# Patient Record
Sex: Female | Born: 1995 | Race: White | Hispanic: No | Marital: Married | Smoking: Former smoker
Health system: Southern US, Community
[De-identification: ages and names within clinical notes are randomized; demographics above are authoritative.]

## PROBLEM LIST (undated history)

## (undated) ENCOUNTER — Ambulatory Visit: Payer: Medicaid Other

## (undated) ENCOUNTER — Inpatient Hospital Stay: Payer: Self-pay

## (undated) DIAGNOSIS — O09212 Supervision of pregnancy with history of pre-term labor, second trimester: Secondary | ICD-10-CM

## (undated) DIAGNOSIS — N912 Amenorrhea, unspecified: Secondary | ICD-10-CM

## (undated) DIAGNOSIS — E669 Obesity, unspecified: Secondary | ICD-10-CM

## (undated) DIAGNOSIS — O34219 Maternal care for unspecified type scar from previous cesarean delivery: Secondary | ICD-10-CM

## (undated) DIAGNOSIS — Z8759 Personal history of other complications of pregnancy, childbirth and the puerperium: Secondary | ICD-10-CM

## (undated) HISTORY — DX: Obesity, unspecified: E66.9

## (undated) HISTORY — DX: Supervision of pregnancy with history of pre-term labor, second trimester: O09.212

## (undated) HISTORY — DX: Maternal care for unspecified type scar from previous cesarean delivery: O34.219

## (undated) HISTORY — DX: Amenorrhea, unspecified: N91.2

## (undated) HISTORY — PX: INSERTION OF IMPLANON ROD: OBO 1005

## (undated) HISTORY — PX: REMOVAL OF IMPLANON ROD: OBO 1006

## (undated) HISTORY — PX: LAPAROSCOPIC APPENDECTOMY: SHX408

## (undated) HISTORY — DX: Personal history of other complications of pregnancy, childbirth and the puerperium: Z87.59

## (undated) HISTORY — PX: APPENDECTOMY: SHX54

---
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
## 2014-10-28 LAB — CBC AND DIFFERENTIAL
HCT: 47 % — AB (ref 36–46)
HEMOGLOBIN: 15.8 g/dL (ref 12.0–16.0)
NEUTROS ABS: 6 /uL
PLATELETS: 389 10*3/uL (ref 150–399)
WBC: 9.7 10^3/mL

## 2014-10-28 LAB — HEPATIC FUNCTION PANEL
ALT: 35 U/L (ref 7–35)
AST: 26 U/L (ref 13–35)
Alkaline Phosphatase: 128 U/L — AB (ref 25–125)
Bilirubin, Total: 0.3 mg/dL

## 2014-10-28 LAB — BASIC METABOLIC PANEL
BUN: 8 mg/dL (ref 4–21)
Creatinine: 0.7 mg/dL (ref 0.5–1.1)
GLUCOSE: 81 mg/dL
Potassium: 4.5 mmol/L (ref 3.4–5.3)
Sodium: 139 mmol/L (ref 137–147)

## 2014-10-28 LAB — TSH: TSH: 1.17 u[IU]/mL (ref 0.41–5.90)

## 2015-01-03 ENCOUNTER — Encounter: Payer: Self-pay | Admitting: Family Medicine

## 2015-01-05 ENCOUNTER — Ambulatory Visit: Payer: Self-pay | Admitting: Family Medicine

## 2015-01-16 ENCOUNTER — Ambulatory Visit: Payer: Self-pay | Admitting: Family Medicine

## 2015-02-06 ENCOUNTER — Encounter: Payer: Self-pay | Admitting: Emergency Medicine

## 2015-02-06 ENCOUNTER — Emergency Department
Admission: EM | Admit: 2015-02-06 | Discharge: 2015-02-06 | Disposition: A | Discharge status: Home / Self Care | Payer: Medicaid Other | Attending: Emergency Medicine | Admitting: Emergency Medicine

## 2015-02-06 DIAGNOSIS — L03316 Cellulitis of umbilicus: Secondary | ICD-10-CM

## 2015-02-06 DIAGNOSIS — F17210 Nicotine dependence, cigarettes, uncomplicated: Secondary | ICD-10-CM | POA: Diagnosis not present

## 2015-02-06 DIAGNOSIS — O9989 Other specified diseases and conditions complicating pregnancy, childbirth and the puerperium: Secondary | ICD-10-CM | POA: Diagnosis present

## 2015-02-06 DIAGNOSIS — O99330 Smoking (tobacco) complicating pregnancy, unspecified trimester: Secondary | ICD-10-CM | POA: Diagnosis not present

## 2015-02-06 DIAGNOSIS — O99719 Diseases of the skin and subcutaneous tissue complicating pregnancy, unspecified trimester: Secondary | ICD-10-CM | POA: Diagnosis not present

## 2015-02-06 DIAGNOSIS — Z3490 Encounter for supervision of normal pregnancy, unspecified, unspecified trimester: Secondary | ICD-10-CM

## 2015-02-06 DIAGNOSIS — Z3A00 Weeks of gestation of pregnancy not specified: Secondary | ICD-10-CM | POA: Diagnosis not present

## 2015-02-06 DIAGNOSIS — R1084 Generalized abdominal pain: Secondary | ICD-10-CM | POA: Insufficient documentation

## 2015-02-06 LAB — URINALYSIS COMPLETE WITH MICROSCOPIC (ARMC ONLY)
BILIRUBIN URINE: NEGATIVE
Glucose, UA: NEGATIVE mg/dL
Hgb urine dipstick: NEGATIVE
Ketones, ur: NEGATIVE mg/dL
NITRITE: NEGATIVE
Protein, ur: NEGATIVE mg/dL
SPECIFIC GRAVITY, URINE: 1.03 (ref 1.005–1.030)
pH: 5 (ref 5.0–8.0)

## 2015-02-06 LAB — COMPREHENSIVE METABOLIC PANEL
ALK PHOS: 103 U/L (ref 38–126)
ALT: 25 U/L (ref 14–54)
AST: 23 U/L (ref 15–41)
Albumin: 4.1 g/dL (ref 3.5–5.0)
Anion gap: 6 (ref 5–15)
BILIRUBIN TOTAL: 0.3 mg/dL (ref 0.3–1.2)
BUN: 9 mg/dL (ref 6–20)
CO2: 25 mmol/L (ref 22–32)
Calcium: 8.6 mg/dL — ABNORMAL LOW (ref 8.9–10.3)
Chloride: 108 mmol/L (ref 101–111)
Creatinine, Ser: 0.7 mg/dL (ref 0.44–1.00)
GFR calc Af Amer: >60 mL/min (ref >60)
GFR calc non Af Amer: >60 mL/min (ref >60)
Glucose, Bld: 93 mg/dL (ref 65–99)
POTASSIUM: 3.2 mmol/L — AB (ref 3.5–5.1)
Sodium: 139 mmol/L (ref 135–145)
Total Protein: 7.1 g/dL (ref 6.5–8.1)

## 2015-02-06 LAB — CBC
HEMATOCRIT: 43.2 % (ref 35.0–47.0)
Hemoglobin: 14.8 g/dL (ref 12.0–16.0)
MCH: 31.2 pg (ref 26.0–34.0)
MCHC: 34.2 g/dL (ref 32.0–36.0)
MCV: 91.2 fL (ref 80.0–100.0)
Platelets: 320 10*3/uL (ref 150–440)
RBC: 4.74 MIL/uL (ref 3.80–5.20)
RDW: 12.9 % (ref 11.5–14.5)
WBC: 12.3 10*3/uL — ABNORMAL HIGH (ref 3.6–11.0)

## 2015-02-06 LAB — POCT PREGNANCY, URINE: Preg Test, Ur: POSITIVE — AB

## 2015-02-06 MED ORDER — CEPHALEXIN 500 MG PO CAPS: 500.0000 mg | ORAL_CAPSULE | Freq: Four times a day (QID) | ORAL | Status: AC

## 2015-02-06 MED ORDER — SULFAMETHOXAZOLE-TRIMETHOPRIM 800-160 MG PO TABS: 1.0000 | ORAL_TABLET | Freq: Two times a day (BID) | ORAL | Status: DC

## 2015-02-06 MED ORDER — CEPHALEXIN 500 MG PO CAPS: 500.0000 mg | ORAL_CAPSULE | Freq: Four times a day (QID) | ORAL | Status: DC

## 2015-02-06 MED ORDER — PRENATAL VITAMINS 0.8 MG PO TABS: 1.0000 | ORAL_TABLET | Freq: Every day | ORAL | Status: DC

## 2015-02-06 MED ORDER — BACITRACIN ZINC 500 UNIT/GM EX OINT: TOPICAL_OINTMENT | CUTANEOUS | Status: DC

## 2015-02-06 MED ORDER — CEPHALEXIN 500 MG PO CAPS
500.0000 mg | ORAL_CAPSULE | Freq: Once | ORAL | Status: AC
  Administered 2015-02-06: 500 mg via ORAL
  Filled 2015-02-06: qty 1

## 2015-02-06 MED ORDER — SULFAMETHOXAZOLE-TRIMETHOPRIM 800-160 MG PO TABS: 1.0000 | ORAL_TABLET | Freq: Once | ORAL | Status: DC

## 2015-02-06 NOTE — Discharge Instructions (Signed)

## 2015-02-06 NOTE — ED Provider Notes (Addendum)
[facility] Emergency Department Provider Note  ____________________________________________  Time seen: Approximately 845 PM  I have reviewed the triage vital signs and the nursing notes.   HISTORY  Chief Complaint Abdominal Pain    HPI Colleen Nicholson is a 19 y.o. female with a history of amenorrhea who presents today with discharge from belly button and pain around his belly button over the past week. Denies any fever. Denies any nausea or vomiting. Denies any vaginal discharge. Denies any dysuria.   Past Medical History  Diagnosis Date  . Amenorrhea   . Obesity     Patient Active Problem List   Diagnosis Date Noted  . Irregular periods/menstrual cycles 01/03/2015  . History of chlamydia infection 01/03/2015  . Family planning 01/03/2015  . Obesity (BMI 30-39.9) 01/03/2015    Past Surgical History  Procedure Laterality Date  . No past surgeries      No current outpatient prescriptions on file.  Allergies Review of patient's allergies indicates no known allergies.  No family history on file.  Social History History  Substance Use Topics  . Smoking status: Current Every Day Smoker -- 0.50 packs/day  . Smokeless tobacco: Not on file  . Alcohol Use: No    Review of Systems Constitutional: No fever/chills Eyes: No visual changes. ENT: No sore throat. Cardiovascular: Denies chest pain. Respiratory: Denies shortness of breath. Gastrointestinal: No abdominal pain.  No nausea, no vomiting.  No diarrhea.  No constipation. Genitourinary: Negative for dysuria. Musculoskeletal: Negative for back pain. Skin: Negative for rash. Neurological: Negative for headaches, focal weakness or numbness.  10-point ROS otherwise negative.  ____________________________________________   PHYSICAL EXAM:  VITAL SIGNS: ED Triage Vitals  Enc Vitals Group     BP 02/06/15 1755 134/69 mmHg     Pulse Rate 02/06/15 1755 107     Resp 02/06/15 1755 16     Temp 02/06/15 1755 98.5 F (36.9 C)     Temp Source 02/06/15 1755 Oral     SpO2 02/06/15 1755 97 %     Weight 02/06/15 1755 175 lb (79.379 kg)     Height 02/06/15 1755  (1.651 m)     Head Cir --      Peak Flow --      Pain Score 02/06/15 1807 5     Pain Loc --      Pain Edu? --      Excl. in GC? --     Constitutional: Alert and oriented. Well appearing and in no acute distress. Eyes: Conjunctivae are normal. PERRL. EOMI. Head: Atraumatic. Nose: No congestion/rhinnorhea. Mouth/Throat: Mucous membranes are moist.  Oropharynx non-erythematous. Neck: No stridor.   Cardiovascular: Normal rate, regular rhythm. Grossly normal heart sounds.  Good peripheral circulation. Respiratory: Normal respiratory effort.  No retractions. Lungs CTAB. Gastrointestinal: Soft and nontender. No distention. No abdominal bruits. No CVA tenderness. Musculoskeletal: No lower extremity tenderness nor edema.  No joint effusions. Neurologic:  Normal speech and language. No gross focal neurologic deficits are appreciated. No gait instability. Skin:  Skin is warm, dry and intact. Small amount of crusted blood within around the umbilicus. No visible pus or laceration or abrasion. Umbilicus is examined to the base without any visible break in the skin. No surrounding induration. No tenderness palpation. No fluctuance.  Psychiatric: Mood and affect are normal. Speech and behavior are normal.  ____________________________________________   LABS (all labs ordered are listed, but only abnormal results are displayed)  Labs Reviewed  COMPREHENSIVE METABOLIC PANEL -  Abnormal; Notable for the following:    Potassium 3.2 (*)    Calcium 8.6 (*)    All other components within normal limits  CBC - Abnormal; Notable for the following:    WBC 12.3 (*)    All other components within normal limits  URINALYSIS COMPLETEWITH MICROSCOPIC ([facility] ONLY) - Abnormal; Notable for the following:    Color, Urine YELLOW (*)     APPearance HAZY (*)    Leukocytes, UA 3+ (*)    Bacteria, UA RARE (*)    Squamous Epithelial / LPF 6-30 (*)    All other components within normal limits  POC URINE PREG, ED   ____________________________________________  EKG   ____________________________________________  RADIOLOGY   ____________________________________________   PROCEDURES    ____________________________________________   INITIAL IMPRESSION / ASSESSMENT AND PLAN / ED COURSE  Pertinent labs & imaging results that were available during my care of the patient were reviewed by me and considered in my medical decision making (see chart for details).  ----------------------------------------- 9:12 PM on 02/06/2015 -----------------------------------------  Patient resting comfortable at this time. We'll treat for cellulitis at the umbilicus. We'll also give antibacterial ointment prescription. Counseled patient to keep the area dry. Elevated white count likely related to cellulitis. 3+ leukocytes in the urine and a contaminated sample. No dysuria. ____________________________________________   FINAL CLINICAL IMPRESSION(S) / ED DIAGNOSES  Acute cellulitis. Initial visit.    Myrna Blazer, MD 02/06/15 2112 heart rate in the room at this time is 66 bpm.  Myrna Blazer, MD 02/06/15 2113  Patient found to have a positive pregnancy test. I will not worker for pregnancy with abdominal pain at this time because it is clear the abdominal pain is coming from the umbilical issue. I counseled the patient as well as her husband about this nail will follow-up at [facility] side OB/GYN with a been seen previously. The patient is unsure how far along she may be because she does not have a period regularly. She says she was on Depo-Provera for years and has not menstruated since being off of Depo-Provera.  Myrna Blazer, MD 02/06/15 2122

## 2015-02-06 NOTE — ED Notes (Signed)
Pt reports 5 days of periumbilical pain with yellow drainage and stomach cramps.

## 2015-02-06 NOTE — ED Notes (Signed)
Patient presents to the ED with pain around her umbilical area, patient reports blood and yellow drainage from umbilical area.  Patient also reports occasional abdominal cramps x 1 week.  Denies vomiting and diarrhea.

## 2015-03-03 ENCOUNTER — Emergency Department
Admission: EM | Admit: 2015-03-03 | Discharge: 2015-03-03 | Disposition: A | Discharge status: Home / Self Care | Payer: Medicaid Other | Attending: Emergency Medicine | Admitting: Emergency Medicine

## 2015-03-03 ENCOUNTER — Emergency Department: Payer: Medicaid Other

## 2015-03-03 DIAGNOSIS — F17210 Nicotine dependence, cigarettes, uncomplicated: Secondary | ICD-10-CM | POA: Insufficient documentation

## 2015-03-03 DIAGNOSIS — O99331 Smoking (tobacco) complicating pregnancy, first trimester: Secondary | ICD-10-CM | POA: Diagnosis not present

## 2015-03-03 DIAGNOSIS — O200 Threatened abortion: Secondary | ICD-10-CM | POA: Diagnosis not present

## 2015-03-03 DIAGNOSIS — O209 Hemorrhage in early pregnancy, unspecified: Secondary | ICD-10-CM | POA: Diagnosis present

## 2015-03-03 DIAGNOSIS — Z3A01 Less than 8 weeks gestation of pregnancy: Secondary | ICD-10-CM | POA: Insufficient documentation

## 2015-03-03 DIAGNOSIS — Z79899 Other long term (current) drug therapy: Secondary | ICD-10-CM | POA: Diagnosis not present

## 2015-03-03 LAB — BASIC METABOLIC PANEL
Anion gap: 7 (ref 5–15)
BUN: 5 mg/dL — ABNORMAL LOW (ref 6–20)
CALCIUM: 9.5 mg/dL (ref 8.9–10.3)
CO2: 26 mmol/L (ref 22–32)
CREATININE: 0.59 mg/dL (ref 0.44–1.00)
Chloride: 102 mmol/L (ref 101–111)
GFR calc non Af Amer: >60 mL/min (ref >60)
Glucose, Bld: 68 mg/dL (ref 65–99)
Potassium: 4.2 mmol/L (ref 3.5–5.1)
SODIUM: 135 mmol/L (ref 135–145)

## 2015-03-03 LAB — CBC
HCT: 44 % (ref 35.0–47.0)
HEMOGLOBIN: 14.4 g/dL (ref 12.0–16.0)
MCH: 29.9 pg (ref 26.0–34.0)
MCHC: 32.7 g/dL (ref 32.0–36.0)
MCV: 91.5 fL (ref 80.0–100.0)
Platelets: 416 10*3/uL (ref 150–440)
RBC: 4.81 MIL/uL (ref 3.80–5.20)
RDW: 12.8 % (ref 11.5–14.5)
WBC: 9.7 10*3/uL (ref 3.6–11.0)

## 2015-03-03 LAB — HCG, QUANTITATIVE, PREGNANCY: HCG, BETA CHAIN, QUANT, S: 103863 m[IU]/mL — AB (ref <5)

## 2015-03-03 NOTE — Discharge Instructions (Signed)
Please follow-up with your OB/GYN physician in 48 hours for recheck of her beta hCG level. Today's pregnancy hormone level is 103,000. Return to the emergency department for any abdominal pain, increased bleeding, or any other symptom personally concerning to yourself   Threatened Miscarriage A threatened miscarriage is when you have vaginal bleeding during your first 20 weeks of pregnancy but the pregnancy has not ended. Your doctor will do tests to make sure you are still pregnant. The cause of the bleeding may not be known. This condition does not mean your pregnancy will end. It does increase the risk of it ending (complete miscarriage). HOME CARE   Make sure you keep all your doctor visits for prenatal care.  Get plenty of rest.  Do not have sex or use tampons if you have vaginal bleeding.  Do not douche.  Do not smoke or use drugs.  Do not drink alcohol.  Avoid caffeine. GET HELP IF:  You have light bleeding from your vagina.  You have belly pain or cramping.  You have a fever. GET HELP RIGHT AWAY IF:   You have heavy bleeding from your vagina.  You have clots of blood coming from your vagina.  You have bad pain or cramps in your low back or belly.  You have fever, chills, and bad belly pain. MAKE SURE YOU:   Understand these instructions.  Will watch your condition.  Will get help right away if you are not doing well or get worse. Document Released: 06/16/2008 Document Revised: 07/09/2013 Document Reviewed: 04/30/2013 [facility] Patient Information 2015 [place], [place]. This information is not intended to replace advice given to you by your health care provider. Make sure you discuss any questions you have with your health care provider.

## 2015-03-03 NOTE — ED Provider Notes (Signed)
[facility] Emergency Department Provider Note  Time seen: 3:47 PM  I have reviewed the triage vital signs and the nursing notes.   HISTORY  Chief Complaint Vaginal Bleeding    HPI Colleen Nicholson is a 19 y.o. female who is approximately [redacted] weeks pregnant who presents the emergency department with vaginal bleeding. According to the patient she is [redacted] weeks pregnant by her last period. She has not had an ultrasound yet. She awoke this morning with vaginal bleeding when she wiped. Had one more episode later this morning. It has since resolved per patient. Denies any abdominal pain or cramping at any time. Denies nausea or vomiting. Denies diarrhea or dysuria.     Past Medical History  Diagnosis Date  . Amenorrhea   . Obesity     Patient Active Problem List   Diagnosis Date Noted  . Irregular periods/menstrual cycles 01/03/2015  . History of chlamydia infection 01/03/2015  . Family planning 01/03/2015  . Obesity (BMI 30-39.9) 01/03/2015    Past Surgical History  Procedure Laterality Date  . No past surgeries      Current Outpatient Rx  Name  Route  Sig  Dispense  Refill  . Prenatal Multivit-Min-Fe-FA (PRENATAL VITAMINS) 0.8 MG tablet   Oral   Take 1 tablet by mouth daily.   30 tablet   0     Allergies Review of patient's allergies indicates no known allergies.  No family history on file.  Social History Social History  Substance Use Topics  . Smoking status: Current Every Day Smoker -- 0.50 packs/day    Types: Cigarettes  . Smokeless tobacco: None  . Alcohol Use: No    Review of Systems Constitutional: Negative for fever. Cardiovascular: Negative for chest pain. Respiratory: Negative for shortness of breath. Gastrointestinal: Negative for abdominal pain, vomiting and diarrhea. Genitourinary: Negative for dysuria. Positive for vaginal bleeding, which is now resolved. Musculoskeletal: Negative for back pain 10-point ROS otherwise  negative.  ____________________________________________   PHYSICAL EXAM:  VITAL SIGNS: ED Triage Vitals  Enc Vitals Group     BP 03/03/15 1320 128/69 mmHg     Pulse Rate 03/03/15 1320 102     Resp 03/03/15 1320 16     Temp 03/03/15 1320 97.8 F (36.6 C)     Temp Source 03/03/15 1320 Oral     SpO2 03/03/15 1320 97 %     Weight 03/03/15 1320 200 lb (90.719 kg)     Height 03/03/15 1320  (1.626 m)     Head Cir --      Peak Flow --      Pain Score --      Pain Loc --      Pain Edu? --      Excl. in GC? --     Constitutional: Alert and oriented. Well appearing and in no distress. Eyes: Normal exam ENT   Mouth/Throat: Mucous membranes are moist. Cardiovascular: Normal rate, regular rhythm. No murmurs, rubs, or gallops. Respiratory: Normal respiratory effort without tachypnea nor retractions. Breath sounds are clear and equal bilaterally. No wheezes/rales/rhonchi. Gastrointestinal: Soft and nontender. No distention.  Musculoskeletal: Nontender with normal range of motion in all extremities. Neurologic:  Normal speech and language. No gross focal neurologic deficits Skin:  Skin is warm, dry and intact.  Psychiatric: Mood and affect are normal. Speech and behavior are normal.  ____________________________________________      RADIOLOGY  Ultrasound consistent with 7 week 4 day fetus.  ____________________________________________  INITIAL IMPRESSION / ASSESSMENT AND PLAN / ED COURSE  Pertinent labs & imaging results that were available during my care of the patient were reviewed by me and considered in my medical decision making (see chart for details).  Patient approximately [redacted] weeks pregnant with vaginal bleeding today which is now resolved. We will check labs, and an ultrasound to help further evaluate. Patient is agreeable to plan. No pain currently or at any time per patient.  Ultrasound shows no acute abnormalities. Small subchorionic hemorrhages present.  Discussed results with the patient, and the need to follow up with OB/GYN in 48 hours for recheck of her beta hCG. The patient is agreeable.  ____________________________________________   FINAL CLINICAL IMPRESSION(S) / ED DIAGNOSES  Threatened miscarriage   Minna Antis, MD 03/03/15 [phone]

## 2015-03-03 NOTE — ED Notes (Signed)
Pt c/o vaginal bleeding when she wipes that started this morning..states she is about [redacted] weeks pregnant.Marland Kitchendenies any cramping or pain at present.

## 2015-03-03 NOTE — ED Notes (Signed)
Pt reports vaginal bleeding that began this AM, pt states no pain

## 2015-03-04 LAB — ABO/RH: ABO/RH(D): O POS

## 2015-03-16 ENCOUNTER — Ambulatory Visit
Admission: RE | Admit: 2015-03-16 | Discharge: 2015-03-16 | Disposition: A | Discharge status: Home / Self Care | Payer: Medicaid Other | Source: Ambulatory Visit | Attending: Maternal & Fetal Medicine | Admitting: Maternal & Fetal Medicine

## 2015-03-16 VITALS — BP 127/76 | HR 95 | Temp 98.8°F | Ht 65.5 in | Wt 223.0 lb

## 2015-03-16 DIAGNOSIS — Z820 Family history of epilepsy and other diseases of the nervous system: Secondary | ICD-10-CM

## 2015-03-16 DIAGNOSIS — Z8279 Family history of other congenital malformations, deformations and chromosomal abnormalities: Secondary | ICD-10-CM

## 2015-03-16 NOTE — Progress Notes (Signed)
[name] presented to the [facility] in [place] for genetic counseling to discuss her partner's personal history of Neurofibromatosis.  Because she was unaware of the reason for referral and because her husband was not present, the couple elected to reschedule their appointment to 03/18/15 @ 2pm.  I had the opportunity to speak with Mr. Leichter by phone who provided me with verbal consent to review his medical records through [facility].  His name and date of birth are Colleen Nicholson, dob: [birth year].  I was immediately available and supervising. Argentina Ponder, MD [facility]

## 2015-03-19 ENCOUNTER — Ambulatory Visit
Admission: RE | Admit: 2015-03-19 | Discharge: 2015-03-19 | Disposition: A | Discharge status: Home / Self Care | Payer: Medicaid Other | Source: Ambulatory Visit | Attending: Obstetrics & Gynecology | Admitting: Obstetrics & Gynecology

## 2015-03-19 VITALS — BP 137/75 | HR 101 | Temp 98.1°F | Ht 64.8 in | Wt 222.4 lb

## 2015-03-19 DIAGNOSIS — Z820 Family history of epilepsy and other diseases of the nervous system: Secondary | ICD-10-CM

## 2015-03-19 DIAGNOSIS — Z8279 Family history of other congenital malformations, deformations and chromosomal abnormalities: Secondary | ICD-10-CM

## 2015-03-19 NOTE — Progress Notes (Addendum)
Referring Provider:  [facility] 40 minute consultation  Colleen Nicholson was referred to [facility] [place] for genetic counseling due to a family history of Neurofibromatosis and to review routine prenatal screening and testing options.  This note summarizes the information we discussed.   We first obtained a detailed family history.  Ms. Kvamme reported that her partner, Colleen Nicholson (dob 07/03/1991) has been diagnosed with von Recklinghausen Neurofibromatosis (NF1).  He said he has had multiple caf-au-lait spots, freckling and a few neurofibromas during his lifetime.  He was diagnosed as a child and is followed at [facility].  After obtaining permission, a review of his records showed documentation that he is seen there with a diagnosis of NF1 but no genetic testing was found.  There are no other persons in the family that have been diagnosed with NF or who have caf au lait spots, tumors or health concerns related to NF.  The remainder of the family history was remarkable for Colleen Nicholson having a paternal first cousin who passed away in early childhood with some type of "syndrome", but he was not sure what type.  Colleen Nicholson also reported a maternal first cousin with developmental differences and hearing impairment.  We talked about the fact that there may be many different syndromes and many causes for developmental differences.  Without additional medical information on the cause for the conditions in these relatives, an accurate recurrence risk assessment cannot be provided.  If more is learned, we are happy to discuss this further. There were no other persons with birth defects, developmental disabilities, recurrent miscarriages or known genetic conditions in the family.  Colleen Nicholson has had no complications or exposures in the pregnancy which are expected to increase the risk for birth defects.    To review, NF is a condition characterized by the presence of multiple caf-au-lait  spots, axillary and inguinal freckling, multiple cutaneous neurofibromas, and iris Lisch nodules.  Many patients will also have plexiform neurofibromas, optic nerve and other central nervous system gliomas, malignant peripheral nerve sheath tumors or other health concerns.  Up to 50% of persons with NF will have some learning difficulties.  The degree of severity of NF can vary a great deal from one person to another, even within the same family.  NF is inherited at a dominant genetic condition.  To review, genes are the instructions that direct the growth and development of our bodies.  We all have two copies of each of our genes, one from our mother and one from our father.  Genes are packaged into chromosomes.  There are 46 chromosomes in each cell, matched up into 23 pairs.  The gene that causes NF, called NF1, is located on chromosome number 37.  When there is a change one of the copies of this gene, then a person will develop the symptoms of NF. This is called a dominant trait.  If a parent has NF, there is a 50% chance they will pass on the normal copy of the NF1 gene to each of their children and a 50% chance they would pass on the copy with the change that causes the condition.  It is not unusual for a family member to have NF with no other persons in the family having the condition.  We know that about 50% of cases of NF are due to a new change in the gene (mutation).  This change occurs for the first time in that one individual and is not present  in the parents or siblings of the affected person.  A careful examination is required to determine if any other family members have subtle findings of NF.  It can be difficult to see all the symptoms of NF in babies and young children.  We expect >97% of persons with the gene change for NF to have caf-au-lait spots by age 13 years and >93% of persons with the gene to have neurofibromas by age 43 years.  Once a person is diagnosed with NF, there is a 50% chance  for each of their children to inherit the changed gene and thus develop the condition.   To be diagnosed with NF1, a person must meet certain diagnostic criteria. For a child whose parent has NF, it is only necessary to have one clinical feature of NF to be diagnosed with the condition (such as caf-au-lait spots).  Most individuals with NF are diagnosed only by physical examination, not by genetic testing. We have not found medical records to indicate that Colleen Nicholson had any genetic testing in the past.  If Colleen Nicholson were interested in prenatal testing for this pregnancy, then it would be necessary to have genetic test results first on her partner.  If a mutation were identified, then testing on the pregnancy would be available through amniocentesis.  Amniocentesis is an invasive diagnostic test that uses a needle to remove a sample of amniotic fluid.  There are fetal cells in the amniotic fluid which could be tested for the genetic change.  The risk of complications including miscarriage related to the amniocentesis procedure is estimated to be 1 in 200. While prenatal testing could be used to determine if a baby had the gene change to cause NF, it is not able to predict the severity of the condition.    The baby can also be assessed after delivery for signs of NF.  After delivery, we encourage Colleen Nicholson to make her pediatrician aware of this history and to consider a consultation with Medical Genetics to have the baby examined for features of NF.  [facility] can be reached by calling [phone].    In addition to discussing the family history, the following routine screening test options were discussed:   First trimester screening, which includes nuchal translucency ultrasound screen and first trimester maternal serum marker screening.  The nuchal translucency has approximately an 80% detection rate for Down syndrome and can be positive for other chromosome abnormalities as well  as heart defects.  When combined with a maternal serum marker screening, the detection rate is up to 90% for Down syndrome and up to 97% for trisomy 18.     Maternal serum marker screening, a blood test that measures pregnancy proteins, can provide risk assessments for Down syndrome, trisomy 18, and open neural tube defects (spina bifida, anencephaly). Because it does not directly examine the fetus, it cannot positively diagnose or rule out these problems.  Targeted ultrasound uses high frequency sound waves to create an image of the developing fetus.  An ultrasound is often recommended as a routine means of evaluating the pregnancy.  It is also used to screen for fetal anatomy problems (for example, a heart defect) that might be suggestive of a chromosomal or other abnormality.   Should these screening tests indicate an increased concern, then the following diagnostic options would be offered:  The chorionic villus sampling procedure is available for first trimester chromosome analysis.  This involves the withdrawal of a small amount of  chorionic villi (tissue from the developing placenta).  Risk of pregnancy loss is estimated to be approximately 1 in 200 to 1 in 100 (0.5 to 1%).  There is approximately a 1% (1 in 100) chance that the CVS chromosome results will be unclear.  Chorionic villi cannot be tested for neural tube defects.     Amniocentesis involves the removal of a small amount of amniotic fluid from the sac surrounding the fetus with the use of a thin needle inserted through the maternal abdomen and uterus.  Ultrasound guidance is used throughout the procedure.  Fetal cells from amniotic fluid are directly evaluated and > 99.5% of chromosome problems and > 98% of open neural tube defects can be detected. This procedure is generally performed after the 15th week of pregnancy.  The main risks to this procedure include complications leading to miscarriage in less than 1 in 200 cases  (0.5%).  Circulating cell free fetal DNA testing may be used to determine whether or not the baby may have either Down syndrome, trisomy 13, or trisomy 72. This test utilizes a maternal blood sample and DNA sequencing technology to isolate circulating cell free fetal DNA from maternal plasma. The fetal DNA can then be analyzed for DNA sequences that are derived from the three most common chromosomes involved in aneuploidy, chromosomes 13, 18, and 21. If the overall amount of DNA is greater than the expected level for any of these chromosomes, aneuploidy is suspected. While we do not consider it a replacement for invasive testing and karyotype analysis, a negative result from this testing would be reassuring, though not a guarantee of a normal chromosome complement for the baby. An abnormal result is certainly suggestive of an abnormal chromosome complement, though we would still recommend CVS or amniocentesis to confirm any findings from this testing.   Cystic Fibrosis screening was also discussed with the patient. Cystic fibrosis (CF) is one of the most common genetic conditions in persons of Caucasian ancestry.  This condition occurs in approximately 1 in 2,500 Caucasian persons and results in thickened secretions in the lungs, digestive, and reproductive systems.  For a baby to be at risk for having CF, both of the parents must be carriers for this condition.  Approximately 1 in 25 Caucasian persons is a carrier for CF.  Current carrier testing looks for the most common mutations in the gene for CF and can detect approximately 90% of carriers in the Caucasian population.  This means that the carrier screening can greatly reduce, but cannot eliminate, the chance for an individual to have a child with CF.  If an individual is found to be a carrier for CF, then carrier testing would be available for the partner.  CF has also been added to the newborn screening panel in [place], so all newborn babies will be tested at  the time of delivery.  After consideration of the options, the patient elected to proceed with first trimester screening as previously scheduled at her OB office.  She declined CF carrier testing and does not desire prenatal diagnosis for NF in this pregnancy due to the risks.  The patient was encouraged to call with questions or concerns.  We can be contacted at [phone].  Cherly Anderson, MS, CGC  I was immediately available. Savas Elvin, [place] A, MD

## 2015-07-21 ENCOUNTER — Encounter
Admission: EM | Disposition: A | Discharge status: Other Acute Inpatient Hospital | Payer: Self-pay | Source: Home / Self Care | Attending: Obstetrics and Gynecology

## 2015-07-21 ENCOUNTER — Encounter: Payer: Self-pay | Admitting: *Deleted

## 2015-07-21 ENCOUNTER — Inpatient Hospital Stay
Admission: EM | Admit: 2015-07-21 | Discharge: 2015-07-21 | DRG: 778 | Payer: Medicaid Other | Attending: Obstetrics and Gynecology | Admitting: Obstetrics and Gynecology

## 2015-07-21 DIAGNOSIS — O99332 Smoking (tobacco) complicating pregnancy, second trimester: Secondary | ICD-10-CM | POA: Diagnosis present

## 2015-07-21 DIAGNOSIS — Z820 Family history of epilepsy and other diseases of the nervous system: Secondary | ICD-10-CM | POA: Diagnosis not present

## 2015-07-21 DIAGNOSIS — O328XX0 Maternal care for other malpresentation of fetus, not applicable or unspecified: Secondary | ICD-10-CM | POA: Diagnosis present

## 2015-07-21 DIAGNOSIS — E669 Obesity, unspecified: Secondary | ICD-10-CM

## 2015-07-21 DIAGNOSIS — Z8279 Family history of other congenital malformations, deformations and chromosomal abnormalities: Secondary | ICD-10-CM

## 2015-07-21 DIAGNOSIS — Z3A27 27 weeks gestation of pregnancy: Secondary | ICD-10-CM

## 2015-07-21 DIAGNOSIS — Z6838 Body mass index (BMI) 38.0-38.9, adult: Secondary | ICD-10-CM

## 2015-07-21 DIAGNOSIS — Z79899 Other long term (current) drug therapy: Secondary | ICD-10-CM | POA: Diagnosis not present

## 2015-07-21 DIAGNOSIS — O99212 Obesity complicating pregnancy, second trimester: Secondary | ICD-10-CM | POA: Diagnosis present

## 2015-07-21 DIAGNOSIS — F17210 Nicotine dependence, cigarettes, uncomplicated: Secondary | ICD-10-CM | POA: Diagnosis present

## 2015-07-21 DIAGNOSIS — Z720 Tobacco use: Secondary | ICD-10-CM | POA: Diagnosis present

## 2015-07-21 DIAGNOSIS — O321XX0 Maternal care for breech presentation, not applicable or unspecified: Secondary | ICD-10-CM | POA: Diagnosis present

## 2015-07-21 DIAGNOSIS — O0992 Supervision of high risk pregnancy, unspecified, second trimester: Secondary | ICD-10-CM

## 2015-07-21 DIAGNOSIS — F17200 Nicotine dependence, unspecified, uncomplicated: Secondary | ICD-10-CM | POA: Diagnosis present

## 2015-07-21 LAB — URINE DRUG SCREEN, QUALITATIVE (ARMC ONLY)
AMPHETAMINES, UR SCREEN: NOT DETECTED
Barbiturates, Ur Screen: NOT DETECTED
Benzodiazepine, Ur Scrn: NOT DETECTED
Cannabinoid 50 Ng, Ur ~~LOC~~: NOT DETECTED
Cocaine Metabolite,Ur ~~LOC~~: NOT DETECTED
MDMA (ECSTASY) UR SCREEN: NOT DETECTED
METHADONE SCREEN, URINE: NOT DETECTED
OPIATE, UR SCREEN: NOT DETECTED
Phencyclidine (PCP) Ur S: NOT DETECTED
Tricyclic, Ur Screen: NOT DETECTED

## 2015-07-21 LAB — URINALYSIS COMPLETE WITH MICROSCOPIC (ARMC ONLY)
BILIRUBIN URINE: NEGATIVE
Bacteria, UA: NONE SEEN
Glucose, UA: NEGATIVE mg/dL
HGB URINE DIPSTICK: NEGATIVE
Ketones, ur: NEGATIVE mg/dL
Nitrite: NEGATIVE
Protein, ur: 30 mg/dL — AB
Specific Gravity, Urine: 1.02 (ref 1.005–1.030)
pH: 7 (ref 5.0–8.0)

## 2015-07-21 LAB — CBC
HEMATOCRIT: 38.9 % (ref 35.0–47.0)
Hemoglobin: 13.2 g/dL (ref 12.0–16.0)
MCH: 30.5 pg (ref 26.0–34.0)
MCHC: 34 g/dL (ref 32.0–36.0)
MCV: 89.7 fL (ref 80.0–100.0)
Platelets: 289 10*3/uL (ref 150–440)
RBC: 4.33 MIL/uL (ref 3.80–5.20)
RDW: 14.3 % (ref 11.5–14.5)
WBC: 16.7 10*3/uL — AB (ref 3.6–11.0)

## 2015-07-21 LAB — TYPE AND SCREEN
ABO/RH(D): O POS
ANTIBODY SCREEN: NEGATIVE

## 2015-07-21 LAB — WET PREP, GENITAL
CLUE CELLS WET PREP: NONE SEEN
Sperm: NONE SEEN
Trich, Wet Prep: NONE SEEN
Yeast Wet Prep HPF POC: NONE SEEN

## 2015-07-21 LAB — RAPID HIV SCREEN (HIV 1/2 AB+AG)
HIV 1/2 ANTIBODIES: NONREACTIVE
HIV-1 P24 Antigen - HIV24: NONREACTIVE

## 2015-07-21 SURGERY — Surgical Case
Anesthesia: Choice

## 2015-07-21 MED ORDER — OXYTOCIN 10 UNIT/ML IJ SOLN: 10.0000 [IU] | Freq: Once | INTRAMUSCULAR | Status: DC

## 2015-07-21 MED ORDER — LACTATED RINGERS IV SOLN
INTRAVENOUS | Status: DC
  Administered 2015-07-21: 16:00:00 via INTRAVENOUS

## 2015-07-21 MED ORDER — LACTATED RINGERS IV SOLN: 500.0000 mL | INTRAVENOUS | Status: DC | PRN

## 2015-07-21 MED ORDER — MAGNESIUM SULFATE BOLUS VIA INFUSION
4.0000 g | Freq: Once | INTRAVENOUS | Status: DC
  Filled 2015-07-21: qty 500

## 2015-07-21 MED ORDER — OXYTOCIN 40 UNITS IN LACTATED RINGERS INFUSION - SIMPLE MED: 2.5000 [IU]/h | INTRAVENOUS | Status: DC

## 2015-07-21 MED ORDER — MAGNESIUM SULFATE 4 GM/100ML IV SOLN
INTRAVENOUS | Status: AC
  Administered 2015-07-21: 4 g
  Filled 2015-07-21: qty 100

## 2015-07-21 MED ORDER — CITRIC ACID-SODIUM CITRATE 334-500 MG/5ML PO SOLN
ORAL | Status: AC
  Administered 2015-07-21: 30 mL via ORAL
  Filled 2015-07-21: qty 15

## 2015-07-21 MED ORDER — SODIUM CHLORIDE 0.9 % IV SOLN
INTRAVENOUS | Status: AC
  Administered 2015-07-21: 2 g via INTRAVENOUS
  Filled 2015-07-21: qty 2000

## 2015-07-21 MED ORDER — OXYTOCIN BOLUS FROM INFUSION: 500.0000 mL | INTRAVENOUS | Status: DC

## 2015-07-21 MED ORDER — SODIUM CHLORIDE 0.9 % IV SOLN
2.0000 g | Freq: Once | INTRAVENOUS | Status: AC
  Administered 2015-07-21: 2 g via INTRAVENOUS

## 2015-07-21 MED ORDER — ONDANSETRON HCL 4 MG/2ML IJ SOLN: 4.0000 mg | Freq: Four times a day (QID) | INTRAMUSCULAR | Status: DC | PRN

## 2015-07-21 MED ORDER — LIDOCAINE HCL (PF) 1 % IJ SOLN: 30.0000 mL | INTRAMUSCULAR | Status: DC | PRN

## 2015-07-21 MED ORDER — MAGNESIUM SULFATE 50 % IJ SOLN
2.0000 g/h | INTRAVENOUS | Status: DC
  Administered 2015-07-21: 2 g/h via INTRAVENOUS

## 2015-07-21 MED ORDER — CITRIC ACID-SODIUM CITRATE 334-500 MG/5ML PO SOLN
30.0000 mL | ORAL | Status: DC | PRN
  Administered 2015-07-21: 30 mL via ORAL

## 2015-07-21 MED ORDER — BETAMETHASONE SOD PHOS & ACET 6 (3-3) MG/ML IJ SUSP
12.0000 mg | Freq: Every day | INTRAMUSCULAR | Status: DC
Start: 2015-07-21 — End: 2015-07-22
  Administered 2015-07-21: 12 mg via INTRAMUSCULAR

## 2015-07-21 MED ORDER — BETAMETHASONE SOD PHOS & ACET 6 (3-3) MG/ML IJ SUSP
INTRAMUSCULAR | Status: AC
  Administered 2015-07-21: 12 mg via INTRAMUSCULAR
  Filled 2015-07-21: qty 1

## 2015-07-21 MED ORDER — MAGNESIUM SULFATE 50 % IJ SOLN
INTRAVENOUS | Status: AC
  Administered 2015-07-21: 2 g/h via INTRAVENOUS
  Filled 2015-07-21: qty 80

## 2015-07-21 NOTE — Progress Notes (Signed)
L&D Note    Subjective:  Unchanged.  Still occasional contraction. No trouble breathing, no mental status changes.  Objective:   Filed Vitals:   07/21/15 2045 07/21/15 2050 07/21/15 2055 07/21/15 2113  BP:  143/92  134/73  Pulse:  108  104  Temp:    99.1 F (37.3 C)  TempSrc:      Resp:    20  SpO2: 99% 98% 98% 98%    Current Vital Signs 24h Vital Sign Ranges  T 99.1 F (37.3 C) Temp  Avg: 98.9 F (37.2 C)  Min: 98.6 F (37 C)  Max: 99.1 F (37.3 C)  BP 134/73 mmHg BP  Min: 134/73  Max: 143/92  HR (!) 104 Pulse  Avg: 104.3  Min: 101  Max: 108  RR 20 Resp  Avg: 19  Min: 18  Max: 20  SaO2 98 %   SpO2  Avg: 98.2 %  Min: 98 %  Max: 99 %      Gen: NAD SVE: 4-5/80/-1 (no change)  Medications SCHEDULED MEDICATIONS  . betamethasone acetate-betamethasone sodium phosphate  12 mg Intramuscular Daily  . magnesium  4 g Intravenous Once  . oxytocin  10 Units Intramuscular Once    MEDICATION INFUSIONS  . lactated ringers 125 mL/hr at 07/21/15 1615  . magnesium sulfate 2 g/hr (07/21/15 1635)  . oxytocin 40 units in LR 1000 mL    . oxytocin      PRN MEDICATIONS  citric acid-sodium citrate, lactated ringers, lidocaine (PF), ondansetron   Assessment & Plan:  20 y.o. G1P0 at 9852w4d with preterm labor, breech presentation *Labor: no cervical change since admission 4.5 hours. Continue magnesium. *Fetal Well-being: reassuring overall.  Betamethasone, magnesium for neuroprophylaxis. *GBS: unknown - ampicilin IV *Analgesia: none indicated at this time. *EMS here to transport patient. Exam to ensure cervix is stable.  Patient understands risks of transfer, including delivery in ambulance with no neonatology present en route.  This could lead to complications, including death, for fetus. Also, there are risks of complications for mother delivering en route.  She voiced understanding and agreement.  Conard NovakJackson, Liboria Putnam D, MD  07/21/2015 9:26 PM  [facility]

## 2015-07-21 NOTE — Progress Notes (Signed)
L&D Note    Subjective:  Occasion contractions.  No issues with trouble breathing, chest pain, feeling disoriented  Objective:   Filed Vitals:   07/21/15 1745  Temp: 98.9 F (37.2 C)  TempSrc: Oral  Resp: 18    Current Vital Signs 24h Vital Sign Ranges  T 98.9 F (37.2 C) Temp  Avg: 98.9 F (37.2 C)  Min: 98.9 F (37.2 C)  Max: 98.9 F (37.2 C)  BP   No Data Recorded  HR   No Data Recorded  RR 18 Resp  Avg: 18  Min: 18  Max: 18  SaO2     No Data Recorded      Gen: NAD FHR: 140/mod var/no accels/no decels Toco: 1 q 10 min SVE: 4-5/80/-1 (fetal foot palpable on exam0  Medications SCHEDULED MEDICATIONS  . betamethasone acetate-betamethasone sodium phosphate  12 mg Intramuscular Daily  . magnesium  4 g Intravenous Once  . oxytocin  10 Units Intramuscular Once    MEDICATION INFUSIONS  . lactated ringers 125 mL/hr at 07/21/15 1615  . magnesium sulfate 2 g/hr (07/21/15 1635)  . oxytocin 40 units in LR 1000 mL    . oxytocin      PRN MEDICATIONS  citric acid-sodium citrate, lactated ringers, lidocaine (PF), ondansetron   Assessment & Plan:  20 y.o. G1P0 at 4216w4d with preterm labor *Labor: continue magnesium sulfate *Fetal Well-being: reassuring, continue magnesium for neuroprophylaxis, betamethasone *GBS: unknown *Analgesia: prn * patient has been stable for several hours. Will attempt to coordinate transfer at this time. Patient agrees to plan.  Conard NovakJackson, Jamillah Camilo D, MD  07/21/2015 7:36 PM  [facility]

## 2015-07-21 NOTE — OB Triage Note (Signed)
Abdominal pain rating 7/10 that started early this weekend. No leaking fluid, noticed scant amount of brown discharge when using the bathroom.

## 2015-07-21 NOTE — H&P (Signed)
OB History & Physical   History of Present Illness:  Chief Complaint: abdominal pain  HPI:  Colleen Nicholson is a 20 y.o. G1P0 female at 4836w4d dated by a 7-week ultrasound in the ED.  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    She reports contractions.   She denies leakage of fluid.   She denies vaginal bleeding.   She reports fetal movement.   She had the onset of lower abdominal pain about 3 days ago that seems to be getting worse. It occurs about once per hour and lasts for about thirty seconds.  It is quite intense with pain level of to 7-8/10.  Nothing makes it better or worse.  No associated symptoms.  The pain does not radiate.  She notes no new urinary symptoms. She had a spot of brown discharge yesterday.  She notes no other vaginal symptoms.  Denies GI symptoms. Denies fevers and chills.    Maternal Medical History:   Past Medical History  Diagnosis Date  . Obesity     Past Surgical History  Procedure Laterality Date  . No past surgeries     Allergies: No Known Allergies  Prior to Admission medications   Medication Sig Start Date End Date Taking? Authorizing Provider  Prenatal Multivit-Min-Fe-FA (PRENATAL VITAMINS) 0.8 MG tablet Take 1 tablet by mouth daily. 02/06/15  Yes Myrna Blazeravid Matthew Schaevitz, MD  ranitidine (ZANTAC) 150 MG tablet Take 150 mg by mouth 2 (two) times daily.   Yes Historical Provider, MD    OB History  Gravida Para Term Preterm AB SAB TAB Ectopic Multiple Living  1         0    # Outcome Date GA Lbr Len/2nd Weight Sex Delivery Anes PTL Lv  1 Current               Prenatal care site: [facility]  Social History: She  reports that she has been smoking Cigarettes.  She has been smoking about 0.50 packs per day. She has never used smokeless tobacco. She reports that she does not drink alcohol or  use illicit drugs.  Family History: family history is not on file.   Review of Systems: Negative x 10 systems reviewed except as noted in the HPI.    Physical Exam:  Vital Signs:  General: no acute distress.  HEENT: normocephalic, atraumatic Heart: regular rate & rhythm.  No murmurs/rubs/gallops Lungs: clear to auscultation bilaterally Abdomen: soft, gravid, non-tender;  EFW: 1,000 grams Pelvic:   External: Normal external female genitalia  Cervix: Dilation: 4 / Effacement (%): 80 / Station: -1  (present part is fetal foot which I could feel through membranes when trying to check her cervix) Extremities: non-tender, symmetric, no edema bilaterally.    Neurologic: Alert & oriented x 3.    Pertinent Results:  Prenatal Labs: Blood type/Rh O positive  Antibody screen negative  Rubella Immune  Varicella Immune    RPR NR  HBsAg unknown  HIV negative  GC negative  Chlamydia negative  Genetic screening First trimester screen positive for Downs risk of 1 in 150.  Panorama (NIPT) testing indicated low risk.  1 hour GTT 57  3 hour GTT N/a  GBS unknown   Baseline FHR: 140 beats/min   Variability: moderate   Accelerations: present (10x10 accels)  Decelerations: absent Contractions: present frequency: irregular and infrequent Overall  assessment: reassuring given gestational age  Bedside Ultrasound:  Number of Fetus: 1  Presentation: FOOTLING BREECH  Fluid: subjectively normal  Placental Location: anterior  Assessment:  Colleen Nicholson is a 20 y.o. G1P0 female at [redacted]w[redacted]d with early preterm labor.   Patient Active Problem List   Diagnosis Date Noted  . High-risk pregnancy in second trimester 07/21/2015    Priority: High  . Labor and delivery, indication for care 07/21/2015  . Preterm labor in second trimester without delivery 07/21/2015  . Obesity affecting pregnancy in second trimester, antepartum 07/21/2015  . BMI 38.0-38.9,adult 07/21/2015  . [redacted] weeks gestation of pregnancy  07/21/2015  . Breech presentation with antenatal problem 07/21/2015  . Smoker 07/21/2015  . Family history of neurofibromatosis, type 1 (von Recklinghausen's disease) 03/16/2015   Plan:  1. Admit to Labor & Delivery  2. CBC, T&S, NPO, IVF 3. GBS unknown.   4. Fetwal well-being: reassuring overall 5. Preterm labor:  1. magnesium for tocolysis and fetal neuroprotection,  2. betamethasone x 2 for fetal lung maturity.  3. Ampicillin for GBS in case ROM or spontaneous fetal version and vaginal delivery. 4. If stable, will transfer.  If not, will have to delivery by c-section, if fetus does not change position spontaneuosly and labor continues 5. NICU/nursing/OR aware.  Conard Novak, MD 07/21/2015 4:15 PM

## 2015-07-21 NOTE — Consult Note (Signed)
Asked by Dr.Jackson to provide prenatal consultation for patient at risk for preterm delivery due to preterm labor.  Mother is 20 y.o. G1 who is now 3027 4/[redacted] weeks EGA, had onset of contractions today and on exam was found to be 4 - 5 cm dilated with footling breech presentation, membranes intact.  No fever or other signs of infection.  She is being treated with betamethasone and tocolysis with MgSO4, and she will be given PCN due to unknown GBS status.  [facility] if she is stable, C/section here if labor progresses despite tocolysis or other contraindication to transfer.  Add: father of baby has Type 1 neurofibromatosis; parents received genetic counseling and had non-invasive 1st trimester screening (results unavailable) but declined amniocentesis.   Discussed the above possibilities with patient, FOB, and other family members in the room (with permission of patient and FOB).  Presented the usual expectations for preterm infant at 3027 weeks gestation, including possible needs for DR resuscitation, respiratory support, and IV access. Also mentioned possibility of complications of preterm birth but presented optimistic long term prognosis.  Projected possible length of stay in NICU until 36 - [redacted] wks EGA.  Discussed advantages of feeding with mother's milk and asked her to pump postnatally.  Patient and FOB were attentive, had appropriate questions, and was appreciative of my input.  Thank you for consulting Neonatology.  Total time 30 minutes Face-to-face time 15 minutes

## 2015-07-22 LAB — HEPATITIS B SURFACE ANTIGEN: HEP B S AG: NEGATIVE

## 2015-07-22 LAB — RPR: RPR: NONREACTIVE

## 2015-08-13 NOTE — Discharge Summary (Signed)
Physician Discharge Summary  Patient ID: Colleen Nicholson MRN: [id number] DOB/AGE: 1996-05-27 20 y.o.  Admit date: 07/21/2015 Discharge date: 08/13/2015  Admission Diagnoses: Principal Problem:   Preterm labor in second trimester without delivery Active Problems:   High-risk pregnancy in second trimester   Family history of neurofibromatosis, type 1 (von Recklinghausen's disease)   Labor and delivery, indication for care   Obesity affecting pregnancy in second trimester, antepartum   BMI 38.0-38.9,adult   [redacted] weeks gestation of pregnancy   Breech presentation with antenatal problem   Smoker  Discharge Diagnoses:  Principal Problem:   Preterm labor in second trimester without delivery Active Problems:   High-risk pregnancy in second trimester   Family history of neurofibromatosis, type 1 (von Recklinghausen's disease)   Labor and delivery, indication for care   Obesity affecting pregnancy in second trimester, antepartum   BMI 38.0-38.9,adult   [redacted] weeks gestation of pregnancy   Breech presentation with antenatal problem   Smoker   Discharged Condition: stable  Hospital Course: Admitted from labor and delivery triage for observation. She was monitored for continued labor.  Upon admission she was noted to be dilated to 4-5cm and on bedside ultrasound the fetus was in footling breech presentation.  She was given betamethasone, a GBS swab was performed.  She was started on ampicillin for GBS unknown status.  She also was started on magnesium sulfate for both fetal neuroprophylaxis and tocolysis (though she was demonstrating fairly infrequent contractions at admission).  She was held NPO and was monitored for clinically stability with a minimum number of cervical checks.  Neonatology was consulted and saw patient and made aware of the situation.  Once the patient was found to be stable, [facility] was contacted and was accepted in transfer.  Upon arrival by EMS, the patient had another cervical exam  that showed she was stable from a labor standpoint.  Her vitals remained stable. She was not having vaginal bleeding.    Consults: Neonatology  Significant Diagnostic Studies:  See flowsheets for labs and vitals  Treatments: IV hydration, antibiotics: ampicillin and steroids (betamethason), labor tocolysis with magnesium sulfate.  Discharge Exam: Blood pressure 134/73, pulse 104, temperature 99.1 F (37.3 C), temperature source Oral, resp. rate 20, SpO2 98 %. General appearance: alert, cooperative and no distress Back: symmetric, no curvature. ROM normal. No CVA tenderness. Resp: clear to auscultation bilaterally Cardio: regular rate and rhythm, S1, S2 normal, no murmur, click, rub or gallop GI: soft, nontendern, non-distended, +BS, gravid with appropriate sized uterus Pelvic: external genitalia normal, no cervical motion tenderness, vagina normal without discharge and cervix 4-5 cm, stable over multiple exams Extremities: extremities normal, atraumatic, no cyanosis or edema and no edema, redness or tenderness in the calves or thighs Neurologic: Alert and oriented X 3, normal strength and tone. Normal symmetric reflexes. Normal coordination and gait  Disposition: [facility] Not Defined  [facility])     Medication List    ASK your doctor about these medications        Prenatal Vitamins 0.8 MG tablet  Take 1 tablet by mouth daily.     ranitidine 150 MG tablet  Commonly known as:  ZANTAC  Take 150 mg by mouth 2 (two) times daily.         Signed: Conard Novak, MD 08/13/2015, 8:50 AM  **Late entry note from 07/21/15

## 2016-01-19 ENCOUNTER — Encounter (HOSPITAL_COMMUNITY): Payer: Self-pay | Admitting: *Deleted

## 2016-02-11 ENCOUNTER — Encounter: Payer: Self-pay | Admitting: Advanced Practice Midwife

## 2016-02-11 ENCOUNTER — Ambulatory Visit (INDEPENDENT_AMBULATORY_CARE_PROVIDER_SITE_OTHER): Payer: Medicaid Other | Admitting: Advanced Practice Midwife

## 2016-02-11 VITALS — BP 126/80 | HR 99 | Wt 235.0 lb

## 2016-02-11 DIAGNOSIS — Z8759 Personal history of other complications of pregnancy, childbirth and the puerperium: Secondary | ICD-10-CM

## 2016-02-11 DIAGNOSIS — O09892 Supervision of other high risk pregnancies, second trimester: Secondary | ICD-10-CM | POA: Diagnosis not present

## 2016-02-11 DIAGNOSIS — Z8751 Personal history of pre-term labor: Secondary | ICD-10-CM

## 2016-02-11 DIAGNOSIS — Z6838 Body mass index (BMI) 38.0-38.9, adult: Secondary | ICD-10-CM | POA: Diagnosis not present

## 2016-02-11 DIAGNOSIS — Z98891 History of uterine scar from previous surgery: Secondary | ICD-10-CM

## 2016-02-11 DIAGNOSIS — Z820 Family history of epilepsy and other diseases of the nervous system: Secondary | ICD-10-CM

## 2016-02-11 DIAGNOSIS — Z3A19 19 weeks gestation of pregnancy: Secondary | ICD-10-CM

## 2016-02-11 DIAGNOSIS — Z1389 Encounter for screening for other disorder: Secondary | ICD-10-CM

## 2016-02-11 DIAGNOSIS — O09212 Supervision of pregnancy with history of pre-term labor, second trimester: Secondary | ICD-10-CM

## 2016-02-11 DIAGNOSIS — Z36 Encounter for antenatal screening of mother: Secondary | ICD-10-CM

## 2016-02-11 DIAGNOSIS — Z363 Encounter for antenatal screening for malformations: Secondary | ICD-10-CM

## 2016-02-11 DIAGNOSIS — Z8279 Family history of other congenital malformations, deformations and chromosomal abnormalities: Secondary | ICD-10-CM

## 2016-02-11 HISTORY — DX: Personal history of other complications of pregnancy, childbirth and the puerperium: Z87.59

## 2016-02-11 MED ORDER — HYDROXYPROGESTERONE CAPROATE 250 MG/ML IM OIL
250.0000 mg | TOPICAL_OIL | INTRAMUSCULAR | Status: DC
  Administered 2016-03-08 – 2016-07-27 (×21): 250 mg via INTRAMUSCULAR

## 2016-02-11 MED ORDER — ASPIRIN EC 81 MG PO TBEC: 81.0000 mg | DELAYED_RELEASE_TABLET | Freq: Every day | ORAL | 2 refills | Status: DC

## 2016-02-11 NOTE — Progress Notes (Signed)
Bedside US shows single IUP measuring [redacted]w[redacted]d and FHR 156

## 2016-02-11 NOTE — Progress Notes (Signed)
Subjective:    Colleen Nicholson is a [age] 54w3dbeing seen today for her first obstetrical visit.  Her obstetrical history is significant for obesity and Hx PTD at 28 weeks, Hx C/S. Went into Spontaneous PTL and then abrupted while in the hospital. Records under Care Everywhere. Patient does intend to breast feed. Pregnancy history fully reviewed.  Patient reports no complaints.  Vitals:   02/11/16 1515  BP: 126/80  Pulse: 99  Weight: 235 lb (106.6 kg)    HISTORY: OB History  Gravida Para Term Preterm AB Living  _0 SAB TAB Ectopic Multiple Live Births          1    # Outcome Date GA Lbr Len/2nd Weight Sex Delivery Anes PTL Lv  2 Current           1 Preterm 07/25/15 241w1d F CS-Unspec   LIV     Past Medical History:  Diagnosis Date  . Amenorrhea   . Medical history non-contributory   . Obesity    Past Surgical History:  Procedure Laterality Date  . NO PAST SURGERIES     No family history on file.   Exam    BP 126/80   Pulse 99   Wt 235 lb (106.6 kg)   LMP 11/02/2015   BMI 39.35 kg/m  Uterine Size: size equals dates. Pos FHR by doppler.  Pelvic Exam: Deferred due to recent exam.                              System: Breast:  No nipple retraction or dimpling, No nipple discharge or bleeding, No axillary or supraclavicular adenopathy, Normal to palpation without dominant masses   Skin: normal coloration and turgor. Rash noted under panus (pt recently started medicine for presumed fungal infection)    Neurologic: negative   Extremities: normal strength, tone, and muscle mass   HEENT neck supple with midline trachea and thyroid without masses   Mouth/Teeth mucous membranes moist, pharynx normal without lesions   Neck supple and no masses   Cardiovascular: regular rate and rhythm, no murmurs or gallops   Respiratory:  appears well, vitals normal, no respiratory distress, acyanotic, normal RR, neck free of mass or lymphadenopathy, chest clear, no  wheezing, crepitations, rhonchi, normal symmetric air entry   Abdomen: Obese, soft, non-tender; bowel sounds normal; no masses,  no organomegaly.    Urinary: urethral meatus normal      Assessment:    Pregnancy: G2P0101 Patient Active Problem List   Diagnosis Date Noted  . Labor and delivery, indication for care 07/21/2015  . Preterm labor in second trimester without delivery 07/21/2015  . High-risk pregnancy in second trimester 07/21/2015  . Obesity affecting pregnancy in second trimester, antepartum 07/21/2015  . BMI 38.0-38.9,adult 07/21/2015  . [redacted] weeks gestation of pregnancy 07/21/2015  . Breech presentation with antenatal problem 07/21/2015  . Smoker 07/21/2015  . Family history of neurofibromatosis, type 1 (von Recklinghausen's disease) 03/16/2015     1. BMI 38.0-38.9,adult  - [place] OB [place] +14 W[place]; Future  2. Family history of neurofibromatosis, type 1 (von Recklinghausen's disease)  - [place] OB [place] +14 WK; Future  3. History of gestational hypertension  - Comp Met (CMET) - Protein / creatinine ratio, urine - aspirin EC 81 MG tablet; Take 1 tablet (81 mg total) by mouth daily. Take after 12 weeks for prevention  of preeclampssia later in pregnancy  Dispense: 300 tablet; Refill: 2 - [place] MFM OB DETAIL +14 WK; Future  4. Encounter for routine screening for malformation using ultrasonics  - [place] MFM OB DETAIL +14 WK; Future  5. [redacted] weeks gestation of pregnancy  - [place] MFM OB DETAIL +14 WK; Future  6. Supervision of other high risk pregnancies, second trimester  - [place] MFM OB DETAIL +14 WK; Future - Prenatal Profile - Culture, OB Urine - hydroxyprogesterone caproate (MAKENA) 250 mg/mL injection 250 mg; Inject 1 mL (250 mg total) into the muscle every 7 (seven) days.  7. Supervision of other high-risk pregnancy, first trimester   8. History of placenta abruption      Plan:     Initial labs drawn. Prenatal vitamins. Problem list reviewed and  updated. Genetic Screening discussed Quad Screen: Discussed, undecided.  Ultrasound discussed; fetal survey: ordered.  Follow up in 4 weeks. Ordered 17-P   Manya Silvas 02/11/2016

## 2016-02-12 ENCOUNTER — Encounter: Payer: Self-pay | Admitting: *Deleted

## 2016-02-12 LAB — PRENATAL PROFILE (SOLSTAS)
ANTIBODY SCREEN: NEGATIVE
BASOS ABS: 0 {cells}/uL (ref 0–200)
Basophils Relative: 0 %
EOS PCT: 2 %
Eosinophils Absolute: 276 cells/uL (ref 15–500)
HEMATOCRIT: 44.2 % (ref 35.0–45.0)
HEMOGLOBIN: 14.8 g/dL (ref 11.7–15.5)
HIV 1&2 Ab, 4th Generation: NONREACTIVE
Hepatitis B Surface Ag: NEGATIVE
LYMPHS ABS: 3312 {cells}/uL (ref 850–3900)
LYMPHS PCT: 24 %
MCH: 30.5 pg (ref 27.0–33.0)
MCHC: 33.5 g/dL (ref 32.0–36.0)
MCV: 91.1 fL (ref 80.0–100.0)
MONOS PCT: 4 %
MPV: 10.2 fL (ref 7.5–12.5)
Monocytes Absolute: 552 cells/uL (ref 200–950)
NEUTROS PCT: 70 %
Neutro Abs: 9660 cells/uL — ABNORMAL HIGH (ref 1500–7800)
Platelets: 355 10*3/uL (ref 140–400)
RBC: 4.85 MIL/uL (ref 3.80–5.10)
RDW: 13.5 % (ref 11.0–15.0)
RH TYPE: POSITIVE
RUBELLA: 2.01 {index} — AB (ref <0.90)
WBC: 13.8 10*3/uL — AB (ref 3.8–10.8)

## 2016-02-12 LAB — COMPREHENSIVE METABOLIC PANEL
ALBUMIN: 4.1 g/dL (ref 3.6–5.1)
ALK PHOS: 92 U/L (ref 33–115)
ALT: 11 U/L (ref 6–29)
AST: 15 U/L (ref 10–30)
BILIRUBIN TOTAL: 0.3 mg/dL (ref 0.2–1.2)
BUN: 6 mg/dL — ABNORMAL LOW (ref 7–25)
CALCIUM: 9.1 mg/dL (ref 8.6–10.2)
CO2: 20 mmol/L (ref 20–31)
Chloride: 105 mmol/L (ref 98–110)
Creat: 0.49 mg/dL — ABNORMAL LOW (ref 0.50–1.10)
GLUCOSE: 68 mg/dL (ref 65–99)
POTASSIUM: 4 mmol/L (ref 3.5–5.3)
Sodium: 137 mmol/L (ref 135–146)
Total Protein: 6.6 g/dL (ref 6.1–8.1)

## 2016-02-13 LAB — CULTURE, OB URINE: Organism ID, Bacteria: 10000

## 2016-02-15 ENCOUNTER — Encounter: Payer: Self-pay | Admitting: *Deleted

## 2016-02-15 LAB — PROTEIN / CREATININE RATIO, URINE
CREATININE, URINE: 341 mg/dL — AB (ref 20–320)
PROTEIN CREATININE RATIO: 59 mg/g{creat} (ref 21–161)
TOTAL PROTEIN, URINE: 20 mg/dL (ref 5–24)

## 2016-02-21 ENCOUNTER — Encounter: Payer: Self-pay | Admitting: Advanced Practice Midwife

## 2016-02-21 DIAGNOSIS — O09892 Supervision of other high risk pregnancies, second trimester: Secondary | ICD-10-CM

## 2016-02-21 DIAGNOSIS — O09899 Supervision of other high risk pregnancies, unspecified trimester: Secondary | ICD-10-CM | POA: Insufficient documentation

## 2016-02-21 DIAGNOSIS — Z98891 History of uterine scar from previous surgery: Secondary | ICD-10-CM | POA: Insufficient documentation

## 2016-02-21 DIAGNOSIS — O09213 Supervision of pregnancy with history of pre-term labor, third trimester: Secondary | ICD-10-CM

## 2016-02-21 DIAGNOSIS — O09893 Supervision of other high risk pregnancies, third trimester: Secondary | ICD-10-CM | POA: Insufficient documentation

## 2016-02-21 HISTORY — DX: Supervision of other high risk pregnancies, second trimester: O09.892

## 2016-02-22 ENCOUNTER — Encounter: Payer: Self-pay | Admitting: *Deleted

## 2016-03-08 ENCOUNTER — Ambulatory Visit (INDEPENDENT_AMBULATORY_CARE_PROVIDER_SITE_OTHER): Payer: Medicaid Other | Admitting: Obstetrics & Gynecology

## 2016-03-08 VITALS — BP 131/84 | HR 103 | Wt 238.0 lb

## 2016-03-08 DIAGNOSIS — F17200 Nicotine dependence, unspecified, uncomplicated: Secondary | ICD-10-CM

## 2016-03-08 DIAGNOSIS — O09212 Supervision of pregnancy with history of pre-term labor, second trimester: Secondary | ICD-10-CM | POA: Diagnosis not present

## 2016-03-08 DIAGNOSIS — O09892 Supervision of other high risk pregnancies, second trimester: Secondary | ICD-10-CM

## 2016-03-08 DIAGNOSIS — Z8759 Personal history of other complications of pregnancy, childbirth and the puerperium: Secondary | ICD-10-CM

## 2016-03-08 DIAGNOSIS — Z98891 History of uterine scar from previous surgery: Secondary | ICD-10-CM

## 2016-03-08 DIAGNOSIS — Z6838 Body mass index (BMI) 38.0-38.9, adult: Secondary | ICD-10-CM

## 2016-03-08 DIAGNOSIS — Z720 Tobacco use: Secondary | ICD-10-CM

## 2016-03-08 MED ORDER — RANITIDINE HCL 150 MG PO TABS: 150.0000 mg | ORAL_TABLET | Freq: Two times a day (BID) | ORAL | 4 refills | Status: DC

## 2016-03-08 NOTE — Progress Notes (Signed)
Early 1Hr GTT scheduled for Thursday this week,

## 2016-03-08 NOTE — Patient Instructions (Signed)

## 2016-03-08 NOTE — Progress Notes (Signed)
Subjective:  Colleen Nicholson is a 20 y.o. G2P0101 at 6559w6d being seen today for ongoing prenatal care.  She is currently monitored for the following issues for this high-risk pregnancy and has Family history of neurofibromatosis, type 1 (von Recklinghausen's disease); BMI 38.0-38.9,adult; Smoker; History of gestational hypertension; History of placenta abruption; History of low transverse cesarean section; History of preterm delivery, currently pregnant in second trimester; and Supervision of other high risk pregnancies, second trimester on her problem list.  Patient reports GERD and lower abd pain occ- none currently .  Contractions: Not present. Vag. Bleeding: None.  Movement: Present. Denies leaking of fluid.   The following portions of the patient's history were reviewed and updated as appropriate: allergies, current medications, past family history, past medical history, past social history, past surgical history and problem list. Problem list updated.  Objective:   Vitals:   03/08/16 1400  BP: 131/84  Pulse: (!) 103  Weight: 238 lb (108 kg)    Fetal Status: Fetal Heart Rate (bpm): 150   Movement: Present     General:  Alert, oriented and cooperative. Patient is in no acute distress.  Skin: Skin is warm and dry. No rash noted.   Cardiovascular: Normal heart rate noted  Respiratory: Normal respiratory effort, no problems with respiration noted  Abdomen: Soft, gravid, appropriate for gestational age. Pain/Pressure: Absent     Pelvic:  Cervical exam deferred        Extremities: Normal range of motion.  Edema: None  Mental Status: Normal mood and affect. Normal behavior. Normal judgment and thought content.   Urinalysis: Urine Protein: Negative Urine Glucose: Negative  Assessment and Plan:  Pregnancy: G2P0101 at 1459w6d  1. Supervision of other high risk pregnancies, second trimester - AFP, Quad Screen  2. Smoker  3. History of preterm delivery, currently pregnant in second  trimester Pt to start weekly 17-OH-P beginning today  4. History of low transverse cesarean section  5. History of gestational hypertension  6. BMI 38.0-38.9,adult  1 hour GTT today  Preterm labor symptoms and general obstetric precautions including but not limited to vaginal bleeding, contractions, leaking of fluid and fetal movement were reviewed in detail with the patient. Please refer to After Visit Summary for other counseling recommendations.  Return in about 4 weeks (around 04/05/2016).   Willodean Rosenthalarolyn Harraway-Smith, MD

## 2016-03-09 LAB — GLUCOSE TOLERANCE, 1 HOUR (50G) W/O FASTING: Glucose, 1 Hr, gestational: 97 mg/dL (ref <140)

## 2016-03-10 ENCOUNTER — Other Ambulatory Visit: Payer: Medicaid Other

## 2016-03-10 ENCOUNTER — Encounter: Payer: Medicaid Other | Admitting: Advanced Practice Midwife

## 2016-03-11 LAB — AFP, QUAD SCREEN
AFP: 17.6 ng/mL
Age Alone: 1:1150 {titer}
CURR GEST AGE: 15.9 wk
HCG, Total: 19.38 IU/mL
INH: 155.1 pg/mL
Interpretation-AFP: NEGATIVE
MoM for AFP: 0.74
MoM for INH: 1.27
MoM for hCG: 0.64
Open Spina bifida: NEGATIVE
Tri 18 Scr Risk Est: NEGATIVE
UE3 MOM: 0.77
UE3 VALUE: 0.53 ng/mL

## 2016-03-14 ENCOUNTER — Encounter (HOSPITAL_COMMUNITY): Payer: Self-pay | Admitting: Advanced Practice Midwife

## 2016-03-16 ENCOUNTER — Ambulatory Visit (INDEPENDENT_AMBULATORY_CARE_PROVIDER_SITE_OTHER): Payer: Medicaid Other | Admitting: *Deleted

## 2016-03-16 DIAGNOSIS — O09212 Supervision of pregnancy with history of pre-term labor, second trimester: Secondary | ICD-10-CM | POA: Diagnosis not present

## 2016-03-16 DIAGNOSIS — O09892 Supervision of other high risk pregnancies, second trimester: Secondary | ICD-10-CM

## 2016-03-23 ENCOUNTER — Other Ambulatory Visit (HOSPITAL_COMMUNITY): Payer: Self-pay | Admitting: *Deleted

## 2016-03-23 ENCOUNTER — Ambulatory Visit: Payer: Medicaid Other | Admitting: *Deleted

## 2016-03-23 ENCOUNTER — Ambulatory Visit (HOSPITAL_COMMUNITY)
Admission: RE | Admit: 2016-03-23 | Discharge: 2016-03-23 | Disposition: A | Discharge status: Home / Self Care | Payer: Medicaid Other | Source: Ambulatory Visit | Attending: Advanced Practice Midwife | Admitting: Advanced Practice Midwife

## 2016-03-23 ENCOUNTER — Encounter (HOSPITAL_COMMUNITY): Payer: Self-pay

## 2016-03-23 VITALS — BP 108/74 | HR 101

## 2016-03-23 DIAGNOSIS — Z820 Family history of epilepsy and other diseases of the nervous system: Secondary | ICD-10-CM

## 2016-03-23 DIAGNOSIS — IMO0002 Reserved for concepts with insufficient information to code with codable children: Secondary | ICD-10-CM

## 2016-03-23 DIAGNOSIS — O09892 Supervision of other high risk pregnancies, second trimester: Secondary | ICD-10-CM

## 2016-03-23 DIAGNOSIS — Z36 Encounter for antenatal screening of mother: Secondary | ICD-10-CM | POA: Diagnosis not present

## 2016-03-23 DIAGNOSIS — Z3A19 19 weeks gestation of pregnancy: Secondary | ICD-10-CM | POA: Diagnosis not present

## 2016-03-23 DIAGNOSIS — Z8759 Personal history of other complications of pregnancy, childbirth and the puerperium: Secondary | ICD-10-CM

## 2016-03-23 DIAGNOSIS — O09212 Supervision of pregnancy with history of pre-term labor, second trimester: Principal | ICD-10-CM

## 2016-03-23 DIAGNOSIS — Z6838 Body mass index (BMI) 38.0-38.9, adult: Secondary | ICD-10-CM

## 2016-03-23 DIAGNOSIS — Z1389 Encounter for screening for other disorder: Secondary | ICD-10-CM

## 2016-03-23 DIAGNOSIS — Z8279 Family history of other congenital malformations, deformations and chromosomal abnormalities: Secondary | ICD-10-CM

## 2016-03-23 DIAGNOSIS — Z0489 Encounter for examination and observation for other specified reasons: Secondary | ICD-10-CM

## 2016-03-30 ENCOUNTER — Ambulatory Visit (INDEPENDENT_AMBULATORY_CARE_PROVIDER_SITE_OTHER): Payer: Medicaid Other | Admitting: *Deleted

## 2016-03-30 DIAGNOSIS — O09212 Supervision of pregnancy with history of pre-term labor, second trimester: Secondary | ICD-10-CM

## 2016-03-30 DIAGNOSIS — O0992 Supervision of high risk pregnancy, unspecified, second trimester: Secondary | ICD-10-CM

## 2016-03-30 NOTE — Progress Notes (Signed)
Pt here today for weekly 17P injection.  Hydroxyprogesterone caproate 250mg  IM given.

## 2016-04-06 ENCOUNTER — Ambulatory Visit (INDEPENDENT_AMBULATORY_CARE_PROVIDER_SITE_OTHER): Payer: Medicaid Other | Admitting: Family Medicine

## 2016-04-06 VITALS — BP 126/78 | HR 114 | Wt 238.0 lb

## 2016-04-06 DIAGNOSIS — O09212 Supervision of pregnancy with history of pre-term labor, second trimester: Secondary | ICD-10-CM | POA: Diagnosis not present

## 2016-04-06 DIAGNOSIS — Z720 Tobacco use: Secondary | ICD-10-CM

## 2016-04-06 DIAGNOSIS — O09892 Supervision of other high risk pregnancies, second trimester: Secondary | ICD-10-CM

## 2016-04-06 DIAGNOSIS — Z8279 Family history of other congenital malformations, deformations and chromosomal abnormalities: Secondary | ICD-10-CM

## 2016-04-06 DIAGNOSIS — Z8759 Personal history of other complications of pregnancy, childbirth and the puerperium: Secondary | ICD-10-CM

## 2016-04-06 DIAGNOSIS — F17200 Nicotine dependence, unspecified, uncomplicated: Secondary | ICD-10-CM

## 2016-04-06 DIAGNOSIS — Z98891 History of uterine scar from previous surgery: Secondary | ICD-10-CM

## 2016-04-06 DIAGNOSIS — O34219 Maternal care for unspecified type scar from previous cesarean delivery: Secondary | ICD-10-CM

## 2016-04-06 DIAGNOSIS — Z820 Family history of epilepsy and other diseases of the nervous system: Secondary | ICD-10-CM

## 2016-04-06 DIAGNOSIS — Z6838 Body mass index (BMI) 38.0-38.9, adult: Secondary | ICD-10-CM

## 2016-04-06 NOTE — Patient Instructions (Signed)

## 2016-04-06 NOTE — Progress Notes (Signed)
   PRENATAL VISIT NOTE  Subjective:  Colleen Nicholson is a 20 y.o. G2P0101 at 2536w0d being seen today for ongoing prenatal care.  She is currently monitored for the following issues for this high-risk pregnancy and has Family history of neurofibromatosis, type 1 (von Recklinghausen's disease); BMI 38.0-38.9,adult; Smoker; History of gestational hypertension; History of placenta abruption; History of low transverse cesarean section; History of preterm delivery, currently pregnant in second trimester; and Supervision of other high risk pregnancies, second trimester on her problem list.  Patient reports no complaints.  Contractions: Not present. Vag. Bleeding: None.  Movement: Absent. Denies leaking of fluid.   The following portions of the patient's history were reviewed and updated as appropriate: allergies, current medications, past family history, past medical history, past social history, past surgical history and problem list. Problem list updated.  Objective:   Vitals:   04/06/16 1411  BP: 126/78  Pulse: (!) 114  Weight: 238 lb (108 kg)    Fetal Status: Fetal Heart Rate (bpm): 156   Movement: Absent     General:  Alert, oriented and cooperative. Patient is in no acute distress.  Skin: Skin is warm and dry. No rash noted.   Cardiovascular: Normal heart rate noted  Respiratory: Normal respiratory effort, no problems with respiration noted  Abdomen: Soft, gravid, appropriate for gestational age. Pain/Pressure: Absent     Pelvic:  Cervical exam deferred        Extremities: Normal range of motion.  Edema: None  Mental Status: Normal mood and affect. Normal behavior. Normal judgment and thought content.   Urinalysis:      Assessment and Plan:  Pregnancy: G2P0101 at 436w0d  1. History of gestational hypertension BP wnl today  2. History of low transverse cesarean section Desires VBAC, needs to sign consent  3. Family history of neurofibromatosis, type 1 (von Recklinghausen's  disease)  4. BMI 38.0-38.9,adult  5. Smoker Recommended cessation  6. History of placenta abruption  7. History of preterm delivery, currently pregnant in second trimester 17 P today  8. Supervision of other high risk pregnancies, second trimester [facility]11  Preterm labor symptoms and general obstetric precautions including but not limited to vaginal bleeding, contractions, leaking of fluid and fetal movement were reviewed in detail with the patient. Please refer to After Visit Summary for other counseling recommendations.  Return in about 4 weeks (around 05/04/2016) for Routine prenatal care- continue weekly 17 P.  Federico FlakeKimberly Niles Morey Andonian, MD

## 2016-04-13 ENCOUNTER — Ambulatory Visit (INDEPENDENT_AMBULATORY_CARE_PROVIDER_SITE_OTHER): Payer: Medicaid Other | Admitting: *Deleted

## 2016-04-13 DIAGNOSIS — O0992 Supervision of high risk pregnancy, unspecified, second trimester: Secondary | ICD-10-CM

## 2016-04-13 DIAGNOSIS — O09212 Supervision of pregnancy with history of pre-term labor, second trimester: Secondary | ICD-10-CM

## 2016-04-20 ENCOUNTER — Encounter: Payer: Self-pay | Admitting: *Deleted

## 2016-04-20 ENCOUNTER — Ambulatory Visit (INDEPENDENT_AMBULATORY_CARE_PROVIDER_SITE_OTHER): Payer: Medicaid Other | Admitting: *Deleted

## 2016-04-20 DIAGNOSIS — O09212 Supervision of pregnancy with history of pre-term labor, second trimester: Secondary | ICD-10-CM

## 2016-04-20 DIAGNOSIS — O0992 Supervision of high risk pregnancy, unspecified, second trimester: Secondary | ICD-10-CM

## 2016-04-26 ENCOUNTER — Observation Stay
Admission: EM | Admit: 2016-04-26 | Discharge: 2016-04-26 | Disposition: A | Discharge status: Home / Self Care | Payer: Medicaid Other | Attending: Obstetrics and Gynecology | Admitting: Obstetrics and Gynecology

## 2016-04-26 DIAGNOSIS — F17210 Nicotine dependence, cigarettes, uncomplicated: Secondary | ICD-10-CM | POA: Diagnosis not present

## 2016-04-26 DIAGNOSIS — O26892 Other specified pregnancy related conditions, second trimester: Secondary | ICD-10-CM | POA: Diagnosis present

## 2016-04-26 DIAGNOSIS — Z3A22 22 weeks gestation of pregnancy: Secondary | ICD-10-CM | POA: Insufficient documentation

## 2016-04-26 DIAGNOSIS — Z8759 Personal history of other complications of pregnancy, childbirth and the puerperium: Secondary | ICD-10-CM

## 2016-04-26 DIAGNOSIS — Z6838 Body mass index (BMI) 38.0-38.9, adult: Secondary | ICD-10-CM | POA: Diagnosis not present

## 2016-04-26 DIAGNOSIS — O99332 Smoking (tobacco) complicating pregnancy, second trimester: Secondary | ICD-10-CM | POA: Diagnosis not present

## 2016-04-26 DIAGNOSIS — O09892 Supervision of other high risk pregnancies, second trimester: Secondary | ICD-10-CM

## 2016-04-26 DIAGNOSIS — O99612 Diseases of the digestive system complicating pregnancy, second trimester: Secondary | ICD-10-CM | POA: Diagnosis not present

## 2016-04-26 DIAGNOSIS — Z79899 Other long term (current) drug therapy: Secondary | ICD-10-CM | POA: Diagnosis not present

## 2016-04-26 DIAGNOSIS — R109 Unspecified abdominal pain: Secondary | ICD-10-CM

## 2016-04-26 DIAGNOSIS — O26899 Other specified pregnancy related conditions, unspecified trimester: Secondary | ICD-10-CM | POA: Diagnosis present

## 2016-04-26 DIAGNOSIS — K219 Gastro-esophageal reflux disease without esophagitis: Secondary | ICD-10-CM | POA: Insufficient documentation

## 2016-04-26 DIAGNOSIS — O99212 Obesity complicating pregnancy, second trimester: Secondary | ICD-10-CM | POA: Insufficient documentation

## 2016-04-26 DIAGNOSIS — Z7982 Long term (current) use of aspirin: Secondary | ICD-10-CM | POA: Diagnosis not present

## 2016-04-26 DIAGNOSIS — Z98891 History of uterine scar from previous surgery: Secondary | ICD-10-CM

## 2016-04-26 DIAGNOSIS — O09212 Supervision of pregnancy with history of pre-term labor, second trimester: Secondary | ICD-10-CM

## 2016-04-26 MED ORDER — ACETAMINOPHEN 325 MG PO TABS
650.0000 mg | ORAL_TABLET | Freq: Four times a day (QID) | ORAL | Status: DC | PRN
  Administered 2016-04-26: 650 mg via ORAL
  Filled 2016-04-26: qty 2

## 2016-04-26 NOTE — Discharge Summary (Signed)
Obstetric History and Physical  Colleen Nicholson is a 20 y.o. G2P0101 with Estimated Date of Delivery: 08/24/16 who presents at 286w6d  presenting for generalized stabbing abdominal pain that resolved after arrival. Reports diarrhea today and feeling full after eating a small amount of food. Low fluid intake. No nausea or vomiting. Also reporting headache today. Patient states she has been having no contractions, no vaginal bleeding, intact membranes, with active fetal movement.    Prenatal Course [facility]  Pregnancy complications or risks: H/o preterm delivery (28 weeks), pre-eclampsia (? Per pt's report, note does not indicate this), LTCS for breech presentation - pt on 17 P Closely spaced pregnancy (delivery 07/2015)  Patient Active Problem List   Diagnosis Date Noted  . Abdominal pain affecting pregnancy 04/26/2016  . History of low transverse cesarean section 02/21/2016  . History of preterm delivery, currently pregnant in second trimester 02/21/2016  . Supervision of other high risk pregnancies, second trimester 02/21/2016  . History of gestational hypertension 02/11/2016  . History of placenta abruption 02/11/2016  . BMI 38.0-38.9,adult 07/21/2015  . Smoker 07/21/2015  . Family history of neurofibromatosis, type 1 (von Recklinghausen's disease) 03/16/2015     Prenatal Transfer Tool   Past Medical History:  Diagnosis Date  . Amenorrhea   . History of gestational hypertension 02/11/2016   Baseline P:C ratio 59  . History of low transverse cesarean section 02/21/2016  . History of placenta abruption 02/11/2016  . History of preterm delivery, currently pregnant in second trimester 02/21/2016  . Medical history non-contributory   . Obesity     Past Surgical History:  Procedure Laterality Date  . NO PAST SURGERIES      OB History  Gravida Para Term Preterm AB Living  2 1   1   1   SAB TAB Ectopic Multiple Live Births          1    # Outcome Date GA Lbr  Len/2nd Weight Sex Delivery Anes PTL Lv  2 Current           1 Preterm 07/25/15 4160w1d   F CS-Unspec   LIV      Social History   Social History  . Marital status: Married    Spouse name: N/A  . Number of children: N/A  . Years of education: N/A   Social History Main Topics  . Smoking status: Current Every Day Smoker    Packs/day: 0.50    Types: Cigarettes  . Smokeless tobacco: Never Used  . Alcohol use No  . Drug use: No  . Sexual activity: Yes   Other Topics Concern  . Not on file   Social History Narrative  . No narrative on file    Family Hx: negative for HTN or Diabetes  Facility-Administered Medications Prior to Admission  Medication Dose Route Frequency Provider Last Rate Last Dose  . hydroxyprogesterone caproate (MAKENA) 250 mg/mL injection 250 mg  250 mg Intramuscular Q7 days [place], CNM   250 mg at 04/20/16 1405   Prescriptions Prior to Admission  Medication Sig Dispense Refill Last Dose  . acetaminophen (TYLENOL) 325 MG tablet Take 650 mg by mouth every 6 (six) hours as needed.   Taking  . aspirin EC 81 MG tablet Take 1 tablet (81 mg total) by mouth daily. Take after 12 weeks for prevention of preeclampssia later in pregnancy 300 tablet 2 Taking  . diphenhydrAMINE (BENADRYL) 12.5 MG/5ML elixir Take by mouth 4 (four) times daily as  needed.   Taking  . Prenatal Multivit-Min-Fe-FA (PRENATAL VITAMINS) 0.8 MG tablet Take 1 tablet by mouth daily. 30 tablet 0 Taking  . ranitidine (ZANTAC) 150 MG tablet Take 1 tablet (150 mg total) by mouth 2 (two) times daily. 60 tablet 4 Taking    No Known Allergies  Review of Systems: Negative except for what is mentioned in HPI.  Physical Exam: Vitals:   04/26/16 2012  BP: 131/65  Pulse: (!) 119  Resp: 18  Temp: 98.2 F (36.8 C)  TempSrc: Oral   LMP 11/02/2015  GENERAL: Well-developed, well-nourished female in no acute distress.  LUNGS: Unlabored breathing ABDOMEN: Soft, nontender, nondistended,  gravid. EXTREMITIES: Nontender, no edema Cervical Exam: Dilatation 0cm   Effacement 0%   Station OOP   Presentation: n/a FHT: 150s   Contractions: none   Pertinent Labs/Studies:   No results found for this or any previous visit (from the past 24 hour(s)).  Assessment : IUP at [redacted]w[redacted]d, no evidence of preterm labor, GERD symptoms  Plan: Discharge Home  F/u at primary OB office for [place] and visit tomorrow Rec to restart Zantac (pt reports running out of meds) to help with GERD sx.  Stressed importance of PO hydration

## 2016-04-26 NOTE — Discharge Instructions (Signed)
Please keep your appointment for tomorrow morning. Call your doctor or return to the ED if symptoms worsen or you have concerns.    LABOR: When contractions begin, you should start to time them from the beginning of one contraction to the beginning of the next.  When contractions are 5-10 minutes apart or less and have been regular for at least an hour, you should call your health care provider.  Notify your doctor if any of the following occur: 1. Bleeding from the vagina 7. Sudden, constant, or occasional abdominal pain  2. Pain or burning when urinating 8. Sudden gushing of fluid from the vagina (with or without continued leaking)  3. Chills or fever 9. Fainting spells, "black outs" or loss of consciousness  4. Increase in vaginal discharge 10. Severe or continued nausea or vomiting  5. Pelvic pressure (sudden increase) 11. Blurring of vision or spots before the eyes  6. Baby moving less than usual 12. Leaking of fluid    FETAL KICK COUNT: Lie on your left side for one hour after a meal, and count the number of times your baby kicks. If it is less than 5 times, get up, move around and drink some juice. Repeat the test 30 minutes later. If it is still less than 5 kicks in an hour, notify your doctor.Preeclampsia and Eclampsia Preeclampsia is a serious condition that develops only during pregnancy. It is also called toxemia of pregnancy. This condition causes high blood pressure along with other symptoms, such as swelling and headaches. These may develop as the condition gets worse. Preeclampsia may occur 20 weeks or later into your pregnancy.  Diagnosing and treating preeclampsia early is very important. If not treated early, it can cause serious problems for you and your baby. One problem it can lead to is eclampsia, which is a condition that causes muscle jerking or shaking (convulsions) in the mother. Delivering your baby is the best treatment for preeclampsia or eclampsia.  RISK FACTORS The  cause of preeclampsia is not known. You may be more likely to develop preeclampsia if you have certain risk factors. These include:   Being pregnant for the first time.  Having preeclampsia in a past pregnancy.  Having a family history of preeclampsia.  Having high blood pressure.  Being pregnant with twins or triplets.  Being 235 or older.  Being African American.  Having kidney disease or diabetes.  Having medical conditions such as lupus or blood diseases.  Being very overweight (obese). SIGNS AND SYMPTOMS  The earliest signs of preeclampsia are:  High blood pressure.  Increased protein in your urine. Your health care provider will check for this at every prenatal visit. Other symptoms that can develop include:   Severe headaches.  Sudden weight gain.  Swelling of your hands, face, legs, and feet.  Feeling sick to your stomach (nauseous) and throwing up (vomiting).  Vision problems (blurred or double vision).  Numbness in your face, arms, legs, and feet.  Dizziness.  Slurred speech.  Sensitivity to bright lights.  Abdominal pain. DIAGNOSIS  There are no screening tests for preeclampsia. Your health care provider will ask you about symptoms and check for signs of preeclampsia during your prenatal visits. You may also have tests, including:  Urine testing.  Blood testing.  Checking your baby's heart rate.  Checking the health of your baby and your placenta using images created with sound waves (ultrasound). TREATMENT  You can work out the best treatment approach together with your health care  provider. It is very important to keep all prenatal appointments. If you have an increased risk of preeclampsia, you may need more frequent prenatal exams.  Your health care provider may prescribe bed rest.  You may have to eat as little salt as possible.  You may need to take medicine to lower your blood pressure if the condition does not respond to more  conservative measures.  You may need to stay in the hospital if your condition is severe. There, treatment will focus on controlling your blood pressure and fluid retention. You may also need to take medicine to prevent seizures.  If the condition gets worse, your baby may need to be delivered early to protect you and the baby. You may have your labor started with medicine (be induced), or you may have a cesarean delivery.  Preeclampsia usually goes away after the baby is born. HOME CARE INSTRUCTIONS   Only take over-the-counter or prescription medicines as directed by your health care provider.  Lie on your left side while resting. This keeps pressure off your baby.  Elevate your feet while resting.  Get regular exercise. Ask your health care provider what type of exercise is safe for you.  Avoid caffeine and alcohol.  Do not smoke.  Drink 6-8 glasses of water every day.  Eat a balanced diet that is low in salt. Do not add salt to your food.  Avoid stressful situations as much as possible.  Get plenty of rest and sleep.  Keep all prenatal appointments and tests as scheduled. SEEK MEDICAL CARE IF:  You are gaining more weight than expected.  You have any headaches, abdominal pain, or nausea.  You are bruising more than usual.  You feel dizzy or light-headed. SEEK IMMEDIATE MEDICAL CARE IF:   You develop sudden or severe swelling anywhere in your body. This usually happens in the legs.  You gain 5 lb (2.3 kg) or more in a week.  You have a severe headache, dizziness, problems with your vision, or confusion.  You have severe abdominal pain.  You have lasting nausea or vomiting.  You have a seizure.  You have trouble moving any part of your body.  You develop numbness in your body.  You have trouble speaking.  You have any abnormal bleeding.  You develop a stiff neck.  You pass out. MAKE SURE YOU:   Understand these instructions.  Will watch your  condition.  Will get help right away if you are not doing well or get worse.   This information is not intended to replace advice given to you by your health care provider. Make sure you discuss any questions you have with your health care provider.   Document Released: 07/01/2000 Document Revised: 07/09/2013 Document Reviewed: 04/26/2013 Elsevier Interactive Patient Education [employer]

## 2016-04-27 ENCOUNTER — Ambulatory Visit (HOSPITAL_COMMUNITY)
Admission: RE | Admit: 2016-04-27 | Discharge: 2016-04-27 | Disposition: A | Discharge status: Home / Self Care | Payer: Medicaid Other | Source: Ambulatory Visit | Attending: Advanced Practice Midwife | Admitting: Advanced Practice Midwife

## 2016-04-27 ENCOUNTER — Encounter (HOSPITAL_COMMUNITY): Payer: Self-pay

## 2016-04-27 ENCOUNTER — Ambulatory Visit (INDEPENDENT_AMBULATORY_CARE_PROVIDER_SITE_OTHER): Payer: Medicaid Other | Admitting: *Deleted

## 2016-04-27 VITALS — BP 114/63 | HR 91 | Wt 235.4 lb

## 2016-04-27 DIAGNOSIS — Z3A23 23 weeks gestation of pregnancy: Secondary | ICD-10-CM | POA: Diagnosis not present

## 2016-04-27 DIAGNOSIS — O09212 Supervision of pregnancy with history of pre-term labor, second trimester: Secondary | ICD-10-CM

## 2016-04-27 DIAGNOSIS — O99212 Obesity complicating pregnancy, second trimester: Secondary | ICD-10-CM | POA: Diagnosis not present

## 2016-04-27 DIAGNOSIS — Z363 Encounter for antenatal screening for malformations: Secondary | ICD-10-CM | POA: Diagnosis not present

## 2016-04-27 DIAGNOSIS — O99332 Smoking (tobacco) complicating pregnancy, second trimester: Secondary | ICD-10-CM | POA: Diagnosis not present

## 2016-04-27 DIAGNOSIS — O09892 Supervision of other high risk pregnancies, second trimester: Secondary | ICD-10-CM | POA: Diagnosis not present

## 2016-04-27 DIAGNOSIS — Z0489 Encounter for examination and observation for other specified reasons: Secondary | ICD-10-CM

## 2016-04-27 DIAGNOSIS — IMO0002 Reserved for concepts with insufficient information to code with codable children: Secondary | ICD-10-CM

## 2016-04-27 DIAGNOSIS — O34219 Maternal care for unspecified type scar from previous cesarean delivery: Secondary | ICD-10-CM | POA: Insufficient documentation

## 2016-04-27 DIAGNOSIS — O09292 Supervision of pregnancy with other poor reproductive or obstetric history, second trimester: Secondary | ICD-10-CM | POA: Insufficient documentation

## 2016-04-27 DIAGNOSIS — O0992 Supervision of high risk pregnancy, unspecified, second trimester: Secondary | ICD-10-CM

## 2016-04-27 NOTE — Progress Notes (Signed)
Pt here today for weekly 17P injection. 

## 2016-05-04 ENCOUNTER — Ambulatory Visit (INDEPENDENT_AMBULATORY_CARE_PROVIDER_SITE_OTHER): Payer: Medicaid Other | Admitting: Family Medicine

## 2016-05-04 VITALS — BP 126/75 | HR 105 | Wt 238.0 lb

## 2016-05-04 DIAGNOSIS — Z98891 History of uterine scar from previous surgery: Secondary | ICD-10-CM

## 2016-05-04 DIAGNOSIS — O34219 Maternal care for unspecified type scar from previous cesarean delivery: Secondary | ICD-10-CM

## 2016-05-04 DIAGNOSIS — O09892 Supervision of other high risk pregnancies, second trimester: Secondary | ICD-10-CM

## 2016-05-04 DIAGNOSIS — O09212 Supervision of pregnancy with history of pre-term labor, second trimester: Secondary | ICD-10-CM | POA: Diagnosis not present

## 2016-05-04 DIAGNOSIS — Z8759 Personal history of other complications of pregnancy, childbirth and the puerperium: Secondary | ICD-10-CM

## 2016-05-04 NOTE — Progress Notes (Signed)
   PRENATAL VISIT NOTE  Subjective:  Colleen Nicholson is a 20 y.o. G2P0101 at 1618w0d being seen today for ongoing prenatal care.  She is currently monitored for the following issues for this high-risk pregnancy and has Family history of neurofibromatosis, type 1 (von Recklinghausen's disease); BMI 38.0-38.9,adult; Smoker; History of gestational hypertension; History of placenta abruption; History of low transverse cesarean section; History of preterm delivery, currently pregnant in second trimester; Supervision of other high risk pregnancies, second trimester; and Abdominal pain affecting pregnancy on her problem list.  Patient reports no complaints.  Contractions: Not present. Vag. Bleeding: None.  Movement: Present. Denies leaking of fluid.   The following portions of the patient's history were reviewed and updated as appropriate: allergies, current medications, past family history, past medical history, past social history, past surgical history and problem list. Problem list updated.  Objective:   Vitals:   05/04/16 1413  BP: 126/75  Pulse: (!) 105  Weight: 238 lb (108 kg)    Fetal Status: Fetal Heart Rate (bpm): 150   Movement: Present     General:  Alert, oriented and cooperative. Patient is in no acute distress.  Skin: Skin is warm and dry. No rash noted.   Cardiovascular: Normal heart rate noted  Respiratory: Normal respiratory effort, no problems with respiration noted  Abdomen: Soft, gravid, appropriate for gestational age. Pain/Pressure: Absent     Pelvic:  Cervical exam deferred        Extremities: Normal range of motion.  Edema: None  Mental Status: Normal mood and affect. Normal behavior. Normal judgment and thought content.   Assessment and Plan:  Pregnancy: G2P0101 at 6618w0d  1. Supervision of other high risk pregnancies, second trimester - f/u growth US scheduled for 11/8, they will complete profile images at this time - up to date - will need 3rd trimester labs -  Get pap smear next visit - Having regular growth US (q4 weeks)  2. History of placenta abruption/GHTN BP wnl today   3. History of low transverse cesarean section VBAC consent signed today. VBAC calculator 63.1%  Preterm labor symptoms and general obstetric precautions including but not limited to vaginal bleeding, contractions, leaking of fluid and fetal movement were reviewed in detail with the patient. Please refer to After Visit Summary for other counseling recommendations.  Return in about 3 weeks (around 05/25/2016) for Routine prenatal care- 27 weeks, 1hr gtt.   Future Appointments Date Time Provider Department Center  05/11/2016 2:00 PM [facility] NURSE [facility]  05/18/2016 11:00 AM [facility] NURSE [facility]  05/25/2016 10:30 AM [facility] US 1 [facility]  05/25/2016 2:00 PM [facility]URSE [facility]  06/01/2016 2:15 PM Federico FlakeKimberly Niles Newton, MD [facility]  06/08/2016 2:00 PM [facility]URSE [facility]  06/15/2016 2:00 PM Catalina AntiguaPeggy Constant, MD [facility]   Federico FlakeKimberly Niles Newton, MD

## 2016-05-09 ENCOUNTER — Encounter: Payer: Self-pay | Admitting: *Deleted

## 2016-05-11 ENCOUNTER — Ambulatory Visit (INDEPENDENT_AMBULATORY_CARE_PROVIDER_SITE_OTHER): Payer: Medicaid Other | Admitting: *Deleted

## 2016-05-11 DIAGNOSIS — O09212 Supervision of pregnancy with history of pre-term labor, second trimester: Secondary | ICD-10-CM | POA: Diagnosis not present

## 2016-05-11 DIAGNOSIS — Z8751 Personal history of pre-term labor: Secondary | ICD-10-CM

## 2016-05-11 NOTE — Progress Notes (Signed)
Pt here today for weekly 17P injection. 

## 2016-05-18 ENCOUNTER — Ambulatory Visit: Payer: Medicaid Other | Admitting: *Deleted

## 2016-05-18 DIAGNOSIS — Z8751 Personal history of pre-term labor: Secondary | ICD-10-CM

## 2016-05-25 ENCOUNTER — Ambulatory Visit (HOSPITAL_COMMUNITY)
Admission: RE | Admit: 2016-05-25 | Discharge: 2016-05-25 | Disposition: A | Discharge status: Home / Self Care | Payer: Medicaid Other | Source: Ambulatory Visit | Attending: Family Medicine | Admitting: Family Medicine

## 2016-05-25 ENCOUNTER — Encounter (HOSPITAL_COMMUNITY): Payer: Self-pay

## 2016-05-25 ENCOUNTER — Ambulatory Visit (INDEPENDENT_AMBULATORY_CARE_PROVIDER_SITE_OTHER): Payer: Medicaid Other | Admitting: *Deleted

## 2016-05-25 ENCOUNTER — Other Ambulatory Visit (HOSPITAL_COMMUNITY): Payer: Self-pay | Admitting: Maternal and Fetal Medicine

## 2016-05-25 DIAGNOSIS — O09212 Supervision of pregnancy with history of pre-term labor, second trimester: Principal | ICD-10-CM

## 2016-05-25 DIAGNOSIS — O09891 Supervision of other high risk pregnancies, first trimester: Secondary | ICD-10-CM

## 2016-05-25 DIAGNOSIS — Z3A27 27 weeks gestation of pregnancy: Secondary | ICD-10-CM | POA: Diagnosis not present

## 2016-05-25 DIAGNOSIS — Z8489 Family history of other specified conditions: Secondary | ICD-10-CM | POA: Diagnosis not present

## 2016-05-25 DIAGNOSIS — O99212 Obesity complicating pregnancy, second trimester: Secondary | ICD-10-CM | POA: Insufficient documentation

## 2016-05-25 DIAGNOSIS — O99332 Smoking (tobacco) complicating pregnancy, second trimester: Secondary | ICD-10-CM | POA: Diagnosis not present

## 2016-05-25 DIAGNOSIS — O34219 Maternal care for unspecified type scar from previous cesarean delivery: Secondary | ICD-10-CM

## 2016-05-25 DIAGNOSIS — O09899 Supervision of other high risk pregnancies, unspecified trimester: Secondary | ICD-10-CM

## 2016-05-25 DIAGNOSIS — Z362 Encounter for other antenatal screening follow-up: Secondary | ICD-10-CM | POA: Diagnosis not present

## 2016-05-25 DIAGNOSIS — O09292 Supervision of pregnancy with other poor reproductive or obstetric history, second trimester: Secondary | ICD-10-CM | POA: Insufficient documentation

## 2016-05-25 DIAGNOSIS — O09219 Supervision of pregnancy with history of pre-term labor, unspecified trimester: Principal | ICD-10-CM

## 2016-05-25 DIAGNOSIS — O09892 Supervision of other high risk pregnancies, second trimester: Secondary | ICD-10-CM

## 2016-05-25 NOTE — Progress Notes (Signed)
Pt here today for weekly 17P injection. 

## 2016-06-01 ENCOUNTER — Encounter: Payer: Self-pay | Admitting: Family Medicine

## 2016-06-01 ENCOUNTER — Ambulatory Visit (INDEPENDENT_AMBULATORY_CARE_PROVIDER_SITE_OTHER): Payer: Medicaid Other | Admitting: Family Medicine

## 2016-06-01 ENCOUNTER — Encounter: Payer: Medicaid Other | Admitting: Family Medicine

## 2016-06-01 VITALS — BP 132/76 | HR 109 | Wt 246.0 lb

## 2016-06-01 DIAGNOSIS — Z23 Encounter for immunization: Secondary | ICD-10-CM

## 2016-06-01 DIAGNOSIS — O09212 Supervision of pregnancy with history of pre-term labor, second trimester: Secondary | ICD-10-CM

## 2016-06-01 DIAGNOSIS — Z8279 Family history of other congenital malformations, deformations and chromosomal abnormalities: Secondary | ICD-10-CM

## 2016-06-01 DIAGNOSIS — Z8759 Personal history of other complications of pregnancy, childbirth and the puerperium: Secondary | ICD-10-CM | POA: Diagnosis not present

## 2016-06-01 DIAGNOSIS — O34219 Maternal care for unspecified type scar from previous cesarean delivery: Secondary | ICD-10-CM | POA: Diagnosis not present

## 2016-06-01 DIAGNOSIS — Z98891 History of uterine scar from previous surgery: Secondary | ICD-10-CM

## 2016-06-01 DIAGNOSIS — Z820 Family history of epilepsy and other diseases of the nervous system: Secondary | ICD-10-CM

## 2016-06-01 DIAGNOSIS — O09892 Supervision of other high risk pregnancies, second trimester: Secondary | ICD-10-CM

## 2016-06-01 NOTE — Progress Notes (Signed)
   PRENATAL VISIT NOTE  Subjective:  Colleen Nicholson is a 20 y.o. G2P0101 at 1121w0d being seen today for ongoing prenatal care.  She is currently monitored for the following issues for this high-risk pregnancy and has Family history of neurofibromatosis, type 1 (von Recklinghausen's disease); BMI 38.0-38.9,adult; Smoker; History of gestational hypertension; History of placenta abruption; History of low transverse cesarean section; History of preterm delivery, currently pregnant in second trimester; Supervision of other high risk pregnancies, second trimester; and Abdominal pain affecting pregnancy on her problem list.  Patient reports no complaints.  Contractions: Not present. Vag. Bleeding: None.  Movement: Present. Denies leaking of fluid.   The following portions of the patient's history were reviewed and updated as appropriate: allergies, current medications, past family history, past medical history, past social history, past surgical history and problem list. Problem list updated.  Objective:   Vitals:   06/01/16 1026  BP: 132/76  Pulse: (!) 109  Weight: 246 lb (111.6 kg)    Fetal Status: Fetal Heart Rate (bpm): 145 Fundal Height: 28 cm Movement: Present     General:  Alert, oriented and cooperative. Patient is in no acute distress.  Skin: Skin is warm and dry. No rash noted.   Cardiovascular: Normal heart rate noted  Respiratory: Normal respiratory effort, no problems with respiration noted  Abdomen: Soft, gravid, appropriate for gestational age. Pain/Pressure: Absent     Pelvic:  Cervical exam deferred        Extremities: Normal range of motion.  Edema: None  Mental Status: Normal mood and affect. Normal behavior. Normal judgment and thought content.   Assessment and Plan:  Pregnancy: G2P0101 at 6821w0d  1. Supervision of other high risk pregnancies, second trimester - Updated pregnancy box-- discussed contraception and is "thinking about nexplanon and her FOB getting fixed".  Reviewed infant feeding and patient pumped/provided breastmilk for 2 week to NICU infant. Patient is open to breastfeeding and we discussed manual expression today - Glucose Tolerance, 2 Hours w/1 Hour - CBC - HIV antibody - RPR - Tdap vaccine greater than or equal to 7yo IM - Flu Vaccine QUAD 36+ mos IM (Fluarix, Quad PF)  2. History of gestational hypertension BP WNL-- slightly above prior but < 140/90  3. History of low transverse cesarean section Desires TOLAC, confirmed verbally and consent in chart  4. Family history of neurofibromatosis, type 1 (von Recklinghausen's disease  5. History of placenta abruption  6. History of preterm delivery, currently pregnant in second trimester 17- received today   Preterm labor symptoms and general obstetric precautions including but not limited to vaginal bleeding, contractions, leaking of fluid and fetal movement were reviewed in detail with the patient. Please refer to After Visit Summary for other counseling recommendations.  Return in about 2 weeks (around 06/15/2016) for Routine prenatal care.   Federico FlakeKimberly Niles Britania Shreeve, MD

## 2016-06-01 NOTE — Patient Instructions (Signed)

## 2016-06-02 LAB — CBC

## 2016-06-02 LAB — RPR

## 2016-06-02 LAB — GLUCOSE TOLERANCE, 2 HOURS W/ 1HR
GLUCOSE, 2 HOUR: 81 mg/dL (ref <140)
GLUCOSE, FASTING: 84 mg/dL (ref 65–99)
GLUCOSE: 168 mg/dL

## 2016-06-02 LAB — HIV ANTIBODY (ROUTINE TESTING W REFLEX): HIV 1&2 Ab, 4th Generation: NONREACTIVE

## 2016-06-06 ENCOUNTER — Encounter: Payer: Self-pay | Admitting: *Deleted

## 2016-06-08 ENCOUNTER — Ambulatory Visit (INDEPENDENT_AMBULATORY_CARE_PROVIDER_SITE_OTHER): Payer: Medicaid Other | Admitting: *Deleted

## 2016-06-08 ENCOUNTER — Ambulatory Visit: Payer: Medicaid Other

## 2016-06-08 DIAGNOSIS — O09893 Supervision of other high risk pregnancies, third trimester: Secondary | ICD-10-CM

## 2016-06-08 DIAGNOSIS — O09213 Supervision of pregnancy with history of pre-term labor, third trimester: Secondary | ICD-10-CM | POA: Diagnosis not present

## 2016-06-08 NOTE — Progress Notes (Signed)
Pt here today for weekly 17P injection.

## 2016-06-15 ENCOUNTER — Ambulatory Visit (INDEPENDENT_AMBULATORY_CARE_PROVIDER_SITE_OTHER): Payer: Medicaid Other | Admitting: Obstetrics and Gynecology

## 2016-06-15 ENCOUNTER — Encounter: Payer: Medicaid Other | Admitting: Obstetrics and Gynecology

## 2016-06-15 VITALS — BP 126/79 | HR 109 | Wt 246.0 lb

## 2016-06-15 DIAGNOSIS — O09892 Supervision of other high risk pregnancies, second trimester: Secondary | ICD-10-CM

## 2016-06-15 DIAGNOSIS — O34219 Maternal care for unspecified type scar from previous cesarean delivery: Secondary | ICD-10-CM

## 2016-06-15 DIAGNOSIS — Z8759 Personal history of other complications of pregnancy, childbirth and the puerperium: Secondary | ICD-10-CM

## 2016-06-15 DIAGNOSIS — O09212 Supervision of pregnancy with history of pre-term labor, second trimester: Secondary | ICD-10-CM | POA: Diagnosis not present

## 2016-06-15 DIAGNOSIS — Z98891 History of uterine scar from previous surgery: Secondary | ICD-10-CM

## 2016-06-15 NOTE — Progress Notes (Signed)
   PRENATAL VISIT NOTE  Subjective:  Colleen Nicholson is a 20 y.o. G2P0101 at 8373w0d being seen today for ongoing prenatal care.  She is currently monitored for the following issues for this high-risk pregnancy and has Family history of neurofibromatosis, type 1 (von Recklinghausen's disease); BMI 38.0-38.9,adult; Smoker; History of gestational hypertension; History of placenta abruption; History of low transverse cesarean section; History of preterm delivery, currently pregnant in second trimester; Supervision of other high risk pregnancies, second trimester; and Abdominal pain affecting pregnancy on her problem list.  Patient reports no complaints.  Contractions: Not present. Vag. Bleeding: None.  Movement: Present. Denies leaking of fluid.   The following portions of the patient's history were reviewed and updated as appropriate: allergies, current medications, past family history, past medical history, past social history, past surgical history and problem list. Problem list updated.  Objective:   Vitals:   06/15/16 1359  BP: 126/79  Pulse: (!) 109  Weight: 246 lb (111.6 kg)    Fetal Status: Fetal Heart Rate (bpm): 148 Fundal Height: 30 cm Movement: Present     General:  Alert, oriented and cooperative. Patient is in no acute distress.  Skin: Skin is warm and dry. No rash noted.   Cardiovascular: Normal heart rate noted  Respiratory: Normal respiratory effort, no problems with respiration noted  Abdomen: Soft, gravid, appropriate for gestational age. Pain/Pressure: Absent     Pelvic:  Cervical exam deferred        Extremities: Normal range of motion.  Edema: None  Mental Status: Normal mood and affect. Normal behavior. Normal judgment and thought content.   Assessment and Plan:  Pregnancy: G2P0101 at 5973w0d  1. Supervision of other high risk pregnancies, second trimester Patient is doing well without complaints Patient is now considering BTL. Medicaid form signed today. Discussed  permanence of the procedure along with the availability of LARC which are just as effective as BTL. Patient is certain in her decision  2. History of gestational hypertension Normotensive today  3. History of preterm delivery, currently pregnant in second trimester Continue weekly 17-P  4. History of low transverse cesarean section Patient still desires TOLAC  Preterm labor symptoms and general obstetric precautions including but not limited to vaginal bleeding, contractions, leaking of fluid and fetal movement were reviewed in detail with the patient. Please refer to After Visit Summary for other counseling recommendations.  Return in about 2 weeks (around 06/29/2016) for ROB and 17-P.   Catalina AntiguaPeggy Summerlyn Fickel, MD

## 2016-06-22 ENCOUNTER — Ambulatory Visit (INDEPENDENT_AMBULATORY_CARE_PROVIDER_SITE_OTHER): Payer: Medicaid Other | Admitting: *Deleted

## 2016-06-22 DIAGNOSIS — O09213 Supervision of pregnancy with history of pre-term labor, third trimester: Secondary | ICD-10-CM

## 2016-06-22 DIAGNOSIS — O09893 Supervision of other high risk pregnancies, third trimester: Secondary | ICD-10-CM

## 2016-06-22 NOTE — Progress Notes (Signed)
Pt here today for weekly 17P injection. 

## 2016-06-29 ENCOUNTER — Ambulatory Visit (INDEPENDENT_AMBULATORY_CARE_PROVIDER_SITE_OTHER): Payer: Medicaid Other | Admitting: Family Medicine

## 2016-06-29 VITALS — BP 116/80 | HR 108 | Wt 249.0 lb

## 2016-06-29 DIAGNOSIS — O09892 Supervision of other high risk pregnancies, second trimester: Secondary | ICD-10-CM

## 2016-06-29 DIAGNOSIS — Z98891 History of uterine scar from previous surgery: Secondary | ICD-10-CM

## 2016-06-29 DIAGNOSIS — Z8759 Personal history of other complications of pregnancy, childbirth and the puerperium: Secondary | ICD-10-CM

## 2016-06-29 DIAGNOSIS — O0993 Supervision of high risk pregnancy, unspecified, third trimester: Secondary | ICD-10-CM

## 2016-06-29 DIAGNOSIS — O09213 Supervision of pregnancy with history of pre-term labor, third trimester: Secondary | ICD-10-CM

## 2016-06-29 DIAGNOSIS — O09212 Supervision of pregnancy with history of pre-term labor, second trimester: Secondary | ICD-10-CM

## 2016-06-29 NOTE — Progress Notes (Signed)
   PRENATAL VISIT NOTE  Subjective:  Colleen Nicholson is a 20 y.o. G2P0101 at 8720w0d being seen today for ongoing prenatal care.  She is currently monitored for the following issues for this high-risk pregnancy and has Family history of neurofibromatosis, type 1 (von Recklinghausen's disease); BMI 38.0-38.9,adult; Smoker; History of gestational hypertension; History of placenta abruption; History of low transverse cesarean section; History of preterm delivery, currently pregnant in second trimester; Supervision of other high risk pregnancies, second trimester; and Abdominal pain affecting pregnancy on her problem list.  Patient reports no complaints.  Contractions: Irritability. Vag. Bleeding: None.  Movement: Present. Denies leaking of fluid.   The following portions of the patient's history were reviewed and updated as appropriate: allergies, current medications, past family history, past medical history, past social history, past surgical history and problem list. Problem list updated.  Objective:   Vitals:   06/29/16 1506  BP: 116/80  Pulse: (!) 108  Weight: 249 lb (112.9 kg)    Fetal Status: Fetal Heart Rate (bpm): 135   Movement: Present     General:  Alert, oriented and cooperative. Patient is in no acute distress.  Skin: Skin is warm and dry. No rash noted.   Cardiovascular: Normal heart rate noted  Respiratory: Normal respiratory effort, no problems with respiration noted  Abdomen: Soft, gravid, appropriate for gestational age. Pain/Pressure: Absent     Pelvic:  Cervical exam deferred        Extremities: Normal range of motion.  Edema: None  Mental Status: Normal mood and affect. Normal behavior. Normal judgment and thought content.   Assessment and Plan:  Pregnancy: G2P0101 at 1020w0d  1. Supervision of high-risk pregnancy, third trimester - Doing well - UTD on prenatal care - Reviewed/confirmed TOLAC and desire for BTL  2. History of preterm delivery, currently pregnant  in second trimester -Continued 17 P until 36  3. History of placenta abruption  4. History of gestational hypertension BP is wnl today  5. History of low transverse cesarean section Desires TOLAC  Preterm labor symptoms and general obstetric precautions including but not limited to vaginal bleeding, contractions, leaking of fluid and fetal movement were reviewed in detail with the patient. Please refer to After Visit Summary for other counseling recommendations.  Return in about 2 weeks (around 07/13/2016) for Routine prenatal care.   Federico FlakeKimberly Niles Newton, MD

## 2016-06-29 NOTE — Patient Instructions (Signed)
Third Trimester of Pregnancy The third trimester is from week 29 through week 40 (months 7 through 9). The third trimester is a time when the unborn baby (fetus) is growing rapidly. At the end of the ninth month, the fetus is about 20 inches in length and weighs 6-10 pounds. Body changes during your third trimester Your body goes through many changes during pregnancy. The changes vary from woman to woman. During the third trimester:  Your weight will continue to increase. You can expect to gain 25-35 pounds (11-16 kg) by the end of the pregnancy.  You may begin to get stretch marks on your hips, abdomen, and breasts.  You may urinate more often because the fetus is moving lower into your pelvis and pressing on your bladder.  You may develop or continue to have heartburn. This is caused by increased hormones that slow down muscles in the digestive tract.  You may develop or continue to have constipation because increased hormones slow digestion and cause the muscles that push waste through your intestines to relax.  You may develop hemorrhoids. These are swollen veins (varicose veins) in the rectum that can itch or be painful.  You may develop swollen, bulging veins (varicose veins) in your legs.  You may have increased body aches in the pelvis, back, or thighs. This is due to weight gain and increased hormones that are relaxing your joints.  You may have changes in your hair. These can include thickening of your hair, rapid growth, and changes in texture. Some women also have hair loss during or after pregnancy, or hair that feels dry or thin. Your hair will most likely return to normal after your baby is born.  Your breasts will continue to grow and they will continue to become tender. A yellow fluid (colostrum) may leak from your breasts. This is the first milk you are producing for your baby.  Your belly button may stick out.  You may notice more swelling in your hands, face, or  ankles.  You may have increased tingling or numbness in your hands, arms, and legs. The skin on your belly may also feel numb.  You may feel short of breath because of your expanding uterus.  You may have more problems sleeping. This can be caused by the size of your belly, increased need to urinate, and an increase in your body's metabolism.  You may notice the fetus "dropping," or moving lower in your abdomen.  You may have increased vaginal discharge.  Your cervix becomes thin and soft (effaced) near your due date. What to expect at prenatal visits You will have prenatal exams every 2 weeks until week 36. Then you will have weekly prenatal exams. During a routine prenatal visit:  You will be weighed to make sure you and the fetus are growing normally.  Your blood pressure will be taken.  Your abdomen will be measured to track your baby's growth.  The fetal heartbeat will be listened to.  Any test results from the previous visit will be discussed.  You may have a cervical check near your due date to see if you have effaced. At around 36 weeks, your health care provider will check your cervix. At the same time, your health care provider will also perform a test on the secretions of the vaginal tissue. This test is to determine if a type of bacteria, Group B streptococcus, is present. Your health care provider will explain this further. Your health care provider may ask you:    What your birth plan is.  How you are feeling.  If you are feeling the baby move.  If you have had any abnormal symptoms, such as leaking fluid, bleeding, severe headaches, or abdominal cramping.  If you are using any tobacco products, including cigarettes, chewing tobacco, and electronic cigarettes.  If you have any questions. Other tests or screenings that may be performed during your third trimester include:  Blood tests that check for low iron levels (anemia).  Fetal testing to check the health,  activity level, and growth of the fetus. Testing is done if you have certain medical conditions or if there are problems during the pregnancy.  Nonstress test (NST). This test checks the health of your baby to make sure there are no signs of problems, such as the baby not getting enough oxygen. During this test, a belt is placed around your belly. The baby is made to move, and its heart rate is monitored during movement. What is false labor? False labor is a condition in which you feel small, irregular tightenings of the muscles in the womb (contractions) that eventually go away. These are called Braxton Hicks contractions. Contractions may last for hours, days, or even weeks before true labor sets in. If contractions come at regular intervals, become more frequent, increase in intensity, or become painful, you should see your health care provider. What are the signs of labor?  Abdominal cramps.  Regular contractions that start at 10 minutes apart and become stronger and more frequent with time.  Contractions that start on the top of the uterus and spread down to the lower abdomen and back.  Increased pelvic pressure and dull back pain.  A watery or bloody mucus discharge that comes from the vagina.  Leaking of amniotic fluid. This is also known as your "water breaking." It could be a slow trickle or a gush. Let your doctor know if it has a color or strange odor. If you have any of these signs, call your health care provider right away, even if it is before your due date. Follow these instructions at home: Eating and drinking  Continue to eat regular, healthy meals.  Do not eat:  Raw meat or meat spreads.  Unpasteurized milk or cheese.  Unpasteurized juice.  Store-made salad.  Refrigerated smoked seafood.  Hot dogs or deli meat, unless they are piping hot.  More than 6 ounces of albacore tuna a week.  Shark, swordfish, king mackerel, or tile fish.  Store-made salads.  Raw  sprouts, such as mung bean or alfalfa sprouts.  Take prenatal vitamins as told by your health care provider.  Take 1000 mg of calcium daily as told by your health care provider.  If you develop constipation:  Take over-the-counter or prescription medicines.  Drink enough fluid to keep your urine clear or pale yellow.  Eat foods that are high in fiber, such as fresh fruits and vegetables, whole grains, and beans.  Limit foods that are high in fat and processed sugars, such as fried and sweet foods. Activity  Exercise only as directed by your health care provider. Healthy pregnant women should aim for 2 hours and 30 minutes of moderate exercise per week. If you experience any pain or discomfort while exercising, stop.  Avoid heavy lifting.  Do not exercise in extreme heat or humidity, or at high altitudes.  Wear low-heel, comfortable shoes.  Practice good posture.  Do not travel far distances unless it is absolutely necessary and only with the approval   of your health care provider.  Wear your seat belt at all times while in a car, on a bus, or on a plane.  Take frequent breaks and rest with your legs elevated if you have leg cramps or low back pain.  Do not use hot tubs, steam rooms, or saunas.  You may continue to have sex unless your health care provider tells you otherwise. Lifestyle  Do not use any products that contain nicotine or tobacco, such as cigarettes and e-cigarettes. If you need help quitting, ask your health care provider.  Do not drink alcohol.  Do not use any medicinal herbs or unprescribed drugs. These chemicals affect the formation and growth of the baby.  If you develop varicose veins:  Wear support pantyhose or compression stockings as told by your healthcare provider.  Elevate your feet for 15 minutes, 3-4 times a day.  Wear a supportive maternity bra to help with breast tenderness. General instructions  Take over-the-counter and prescription  medicines only as told by your health care provider. There are medicines that are either safe or unsafe to take during pregnancy.  Take warm sitz baths to soothe any pain or discomfort caused by hemorrhoids. Use hemorrhoid cream or witch hazel if your health care provider approves.  Avoid cat litter boxes and soil used by cats. These carry germs that can cause birth defects in the baby. If you have a cat, ask someone to clean the litter box for you.  To prepare for the arrival of your baby:  Take prenatal classes to understand, practice, and ask questions about the labor and delivery.  Make a trial run to the hospital.  Visit the hospital and tour the maternity area.  Arrange for maternity or paternity leave through employers.  Arrange for family and friends to take care of pets while you are in the hospital.  Purchase a rear-facing car seat and make sure you know how to install it in your car.  Pack your hospital bag.  Prepare the baby's nursery. Make sure to remove all pillows and stuffed animals from the baby's crib to prevent suffocation.  Visit your dentist if you have not gone during your pregnancy. Use a soft toothbrush to brush your teeth and be gentle when you floss.  Keep all prenatal follow-up visits as told by your health care provider. This is important. Contact a health care provider if:  You are unsure if you are in labor or if your water has broken.  You become dizzy.  You have mild pelvic cramps, pelvic pressure, or nagging pain in your abdominal area.  You have lower back pain.  You have persistent nausea, vomiting, or diarrhea.  You have an unusual or bad smelling vaginal discharge.  You have pain when you urinate. Get help right away if:  You have a fever.  You are leaking fluid from your vagina.  You have spotting or bleeding from your vagina.  You have severe abdominal pain or cramping.  You have rapid weight loss or weight gain.  You have  shortness of breath with chest pain.  You notice sudden or extreme swelling of your face, hands, ankles, feet, or legs.  Your baby makes fewer than 10 movements in 2 hours.  You have severe headaches that do not go away with medicine.  You have vision changes. Summary  The third trimester is from week 29 through week 40, months 7 through 9. The third trimester is a time when the unborn baby (fetus)   is growing rapidly.  During the third trimester, your discomfort may increase as you and your baby continue to gain weight. You may have abdominal, leg, and back pain, sleeping problems, and an increased need to urinate.  During the third trimester your breasts will keep growing and they will continue to become tender. A yellow fluid (colostrum) may leak from your breasts. This is the first milk you are producing for your baby.  False labor is a condition in which you feel small, irregular tightenings of the muscles in the womb (contractions) that eventually go away. These are called Braxton Hicks contractions. Contractions may last for hours, days, or even weeks before true labor sets in.  Signs of labor can include: abdominal cramps; regular contractions that start at 10 minutes apart and become stronger and more frequent with time; watery or bloody mucus discharge that comes from the vagina; increased pelvic pressure and dull back pain; and leaking of amniotic fluid. This information is not intended to replace advice given to you by your health care provider. Make sure you discuss any questions you have with your health care provider. Document Released: 06/28/2001 Document Revised: 12/10/2015 Document Reviewed: 09/04/2012 Elsevier Interactive Patient Education  2017 [employer]  

## 2016-07-06 ENCOUNTER — Ambulatory Visit: Payer: Medicaid Other | Admitting: *Deleted

## 2016-07-06 DIAGNOSIS — O0993 Supervision of high risk pregnancy, unspecified, third trimester: Secondary | ICD-10-CM

## 2016-07-06 DIAGNOSIS — O09892 Supervision of other high risk pregnancies, second trimester: Secondary | ICD-10-CM

## 2016-07-06 DIAGNOSIS — O09212 Supervision of pregnancy with history of pre-term labor, second trimester: Secondary | ICD-10-CM

## 2016-07-13 ENCOUNTER — Ambulatory Visit (INDEPENDENT_AMBULATORY_CARE_PROVIDER_SITE_OTHER): Payer: Medicaid Other | Admitting: Obstetrics and Gynecology

## 2016-07-13 VITALS — BP 130/70 | HR 116 | Wt 250.0 lb

## 2016-07-13 DIAGNOSIS — O99213 Obesity complicating pregnancy, third trimester: Secondary | ICD-10-CM

## 2016-07-13 DIAGNOSIS — E669 Obesity, unspecified: Secondary | ICD-10-CM

## 2016-07-13 DIAGNOSIS — O09213 Supervision of pregnancy with history of pre-term labor, third trimester: Secondary | ICD-10-CM | POA: Diagnosis not present

## 2016-07-13 DIAGNOSIS — O09892 Supervision of other high risk pregnancies, second trimester: Secondary | ICD-10-CM

## 2016-07-13 DIAGNOSIS — Z8759 Personal history of other complications of pregnancy, childbirth and the puerperium: Secondary | ICD-10-CM

## 2016-07-13 DIAGNOSIS — O99210 Obesity complicating pregnancy, unspecified trimester: Secondary | ICD-10-CM

## 2016-07-13 DIAGNOSIS — O09893 Supervision of other high risk pregnancies, third trimester: Secondary | ICD-10-CM | POA: Insufficient documentation

## 2016-07-13 DIAGNOSIS — Z98891 History of uterine scar from previous surgery: Secondary | ICD-10-CM

## 2016-07-13 MED ORDER — PANTOPRAZOLE SODIUM 40 MG PO TBEC: 40.0000 mg | DELAYED_RELEASE_TABLET | Freq: Every day | ORAL | 0 refills | Status: DC

## 2016-07-13 NOTE — Progress Notes (Signed)
Prenatal Visit Note Date: 07/13/2016 Clinic: [facility]  Subjective:  Colleen Nicholson is a 20 y.o. G2P0101 at 4451w0d being seen today for ongoing prenatal care.  She is currently monitored for the following issues for this high-risk pregnancy and has Family history of neurofibromatosis, type 1 (von Recklinghausen's disease); BMI 38.0-38.9,adult; Smoker; History of gestational hypertension; History of placenta abruption; History of low transverse cesarean section; History of preterm delivery, currently pregnant in third trimester; Supervision of other high risk pregnancies, second trimester; Obesity in pregnancy; and Short interval between pregnancies affecting pregnancy in third trimester, antepartum on her problem list.  Patient reports no complaints.   Contractions: Not present.  .  Movement: Present. Denies leaking of fluid.   The following portions of the patient's history were reviewed and updated as appropriate: allergies, current medications, past family history, past medical history, past social history, past surgical history and problem list. Problem list updated.  Objective:   Vitals:   07/13/16 1428  BP: 130/70  Pulse: (!) 116  Weight: 250 lb (113.4 kg)    Fetal Status: Fetal Heart Rate (bpm): 140 Fundal Height: 34 cm Movement: Present  Presentation: Vertex  General:  Alert, oriented and cooperative. Patient is in no acute distress.  Skin: Skin is warm and dry. No rash noted.   Cardiovascular: Normal heart rate noted  Respiratory: Normal respiratory effort, no problems with respiration noted  Abdomen: Soft, gravid, appropriate for gestational age. Pain/Pressure: Absent     Pelvic:  Cervical exam deferred        Extremities: Normal range of motion.     Mental Status: Normal mood and affect. Normal behavior. Normal judgment and thought content.   Urinalysis:      Assessment and Plan:  Pregnancy: G2P0101 at 2851w0d  1. Supervision of other high risk  pregnancies, second trimester Routine care. BTL papers signed  2. History of preterm delivery, currently pregnant in third trimester 17p today. Continue to 36wks  3. History of placenta abruption No issues  4. History of low transverse cesarean section TOLAC consent signed  5. History of gestational hypertension No current issues. Pt not taking a baby ASA currently  6. Obesity in pregnancy See above  7. Short interval between pregnancies affecting pregnancy in third trimester, antepartum See above.   Preterm labor symptoms and general obstetric precautions including but not limited to vaginal bleeding, contractions, leaking of fluid and fetal movement were reviewed in detail with the patient. Please refer to After Visit Summary for other counseling recommendations.  Return in about 1 week (around 07/20/2016).   [place] Bingharlie Vernor Monnig, MD

## 2016-07-18 NOTE — L&D Delivery Note (Signed)
21 y.o. [id number] at 2254w6d delivered a viable female infant in cephalic, ROA position. Anterior shoulder delivered with ease. 60 sec delayed cord clamping. Cord clamped x2 and cut. Placenta delivered spontaneously intact, with 3VC. Fundus firm on exam with massage and pitocin. Good hemostasis noted.  Anesthesia: epidural Laceration: 1st degree perineal Suture: 3.0 Vicryl Good hemostasis noted. 300cc  Mom and baby recovering in LDR.    Apgars: 9/9 Weight: pending    Renne Muscaaniel L Warden, MD PGY-1 08/16/2016, 3:59 PM   OB FELLOW DELIVERY ATTESTATION  I was gloved and present for the delivery in its entirety, and I agree with the above resident's note.    Jen MowElizabeth Mumaw, DO OB Fellow 4:25 PM

## 2016-07-20 ENCOUNTER — Ambulatory Visit (INDEPENDENT_AMBULATORY_CARE_PROVIDER_SITE_OTHER): Payer: Medicaid Other | Admitting: *Deleted

## 2016-07-20 DIAGNOSIS — O09893 Supervision of other high risk pregnancies, third trimester: Secondary | ICD-10-CM

## 2016-07-20 DIAGNOSIS — O09213 Supervision of pregnancy with history of pre-term labor, third trimester: Secondary | ICD-10-CM | POA: Diagnosis not present

## 2016-07-20 NOTE — Progress Notes (Signed)
Pt here today for weekly 17P injection. 

## 2016-07-27 ENCOUNTER — Other Ambulatory Visit (HOSPITAL_COMMUNITY)
Admission: RE | Admit: 2016-07-27 | Discharge: 2016-07-27 | Disposition: A | Discharge status: Home / Self Care | Payer: Medicaid Other | Source: Ambulatory Visit | Attending: Family Medicine | Admitting: Family Medicine

## 2016-07-27 ENCOUNTER — Ambulatory Visit (INDEPENDENT_AMBULATORY_CARE_PROVIDER_SITE_OTHER): Payer: Medicaid Other | Admitting: Family Medicine

## 2016-07-27 VITALS — BP 135/82 | HR 108

## 2016-07-27 DIAGNOSIS — O09893 Supervision of other high risk pregnancies, third trimester: Secondary | ICD-10-CM

## 2016-07-27 DIAGNOSIS — Z113 Encounter for screening for infections with a predominantly sexual mode of transmission: Secondary | ICD-10-CM | POA: Insufficient documentation

## 2016-07-27 DIAGNOSIS — Z98891 History of uterine scar from previous surgery: Secondary | ICD-10-CM

## 2016-07-27 DIAGNOSIS — O09892 Supervision of other high risk pregnancies, second trimester: Secondary | ICD-10-CM

## 2016-07-27 DIAGNOSIS — O34219 Maternal care for unspecified type scar from previous cesarean delivery: Secondary | ICD-10-CM

## 2016-07-27 DIAGNOSIS — O99210 Obesity complicating pregnancy, unspecified trimester: Secondary | ICD-10-CM

## 2016-07-27 DIAGNOSIS — F17200 Nicotine dependence, unspecified, uncomplicated: Secondary | ICD-10-CM

## 2016-07-27 DIAGNOSIS — O09213 Supervision of pregnancy with history of pre-term labor, third trimester: Secondary | ICD-10-CM | POA: Diagnosis not present

## 2016-07-27 DIAGNOSIS — Z8759 Personal history of other complications of pregnancy, childbirth and the puerperium: Secondary | ICD-10-CM

## 2016-07-27 DIAGNOSIS — E669 Obesity, unspecified: Secondary | ICD-10-CM

## 2016-07-27 LAB — OB RESULTS CONSOLE GBS: STREP GROUP B AG: NEGATIVE

## 2016-07-27 NOTE — Progress Notes (Signed)
   PRENATAL VISIT NOTE  Subjective:  Colleen Nicholson is a 21 y.o. G2P0101 at 421w0d being seen today for ongoing prenatal care.  She is currently monitored for the following issues for this high-risk pregnancy and has Family history of neurofibromatosis, type 1 (von Recklinghausen's disease); BMI 38.0-38.9,adult; Smoker; History of gestational hypertension; History of placenta abruption; History of low transverse cesarean section; History of preterm delivery, currently pregnant in third trimester; Supervision of other high risk pregnancies, second trimester; Obesity in pregnancy; and Short interval between pregnancies affecting pregnancy in third trimester, antepartum on her problem list.  Patient reports no complaints.  Contractions: Not present. Vag. Bleeding: None.  Movement: Present. Denies leaking of fluid.   The following portions of the patient's history were reviewed and updated as appropriate: allergies, current medications, past family history, past medical history, past social history, past surgical history and problem list. Problem list updated.  Objective:   Vitals:   07/27/16 1500  BP: 135/82  Pulse: (!) 108    Fetal Status: Fetal Heart Rate (bpm): 140   Movement: Present     General:  Alert, oriented and cooperative. Patient is in no acute distress.  Skin: Skin is warm and dry. No rash noted.   Cardiovascular: Normal heart rate noted  Respiratory: Normal respiratory effort, no problems with respiration noted  Abdomen: Soft, gravid, appropriate for gestational age. Pain/Pressure: Absent     Pelvic:  Cervical exam deferred        Extremities: Normal range of motion.  Edema: None  Mental Status: Normal mood and affect. Normal behavior. Normal judgment and thought content.   Assessment and Plan:  Pregnancy: G2P0101 at 441w0d  1. History of gestational hypertension - BP doing well today  2. Supervision of other high risk pregnancies, second trimester -  UTD, received 17-P,  last dose completed - Cervicovaginal ancillary only - Culture, beta strep (group b only) - Reviewed typical IOL at 40-41 weeks - Has multiple questions about childbirth/labor etc. Reviewed all in detail today  3. Obesity in pregnancy 20 lb (9.072 kg)- Slightly higher than expected but not excessive  4. Short interval between pregnancies affecting pregnancy in third trimester, antepartum  5. Smoker Continue to encourage quitting  6. H/o preterm birth - s/p 6817 P injections  Preterm labor symptoms and general obstetric precautions including but not limited to vaginal bleeding, contractions, leaking of fluid and fetal movement were reviewed in detail with the patient. Please refer to After Visit Summary for other counseling recommendations.  Return in about 1 week (around 08/03/2016) for Routine prenatal care.   Federico FlakeKimberly Niles Dimitrious Micciche, MD

## 2016-07-27 NOTE — Patient Instructions (Signed)
Come to the MAU (maternity admission unit) for 1) Strong contractions every 2-3 minutes for at least 1 hour that do not go away when you drink water or take a warm shower. These contractions will be so strong all you can do is breath through them 2) Vaginal bleeding- anything more than spotting 3) Loss of fluid like you broke your water 4) Decreased movement of your baby  

## 2016-07-28 LAB — CERVICOVAGINAL ANCILLARY ONLY
CHLAMYDIA, DNA PROBE: NEGATIVE
Neisseria Gonorrhea: NEGATIVE

## 2016-08-01 LAB — CULTURE, BETA STREP (GROUP B ONLY): STREP GP B CULTURE: NEGATIVE

## 2016-08-03 ENCOUNTER — Encounter: Payer: Medicaid Other | Admitting: Family Medicine

## 2016-08-05 ENCOUNTER — Encounter: Payer: Medicaid Other | Admitting: Obstetrics & Gynecology

## 2016-08-10 ENCOUNTER — Ambulatory Visit (INDEPENDENT_AMBULATORY_CARE_PROVIDER_SITE_OTHER): Payer: Medicaid Other | Admitting: Family Medicine

## 2016-08-10 VITALS — BP 144/79 | HR 117 | Wt 255.0 lb

## 2016-08-10 DIAGNOSIS — O163 Unspecified maternal hypertension, third trimester: Secondary | ICD-10-CM

## 2016-08-10 DIAGNOSIS — O99210 Obesity complicating pregnancy, unspecified trimester: Secondary | ICD-10-CM

## 2016-08-10 DIAGNOSIS — O09213 Supervision of pregnancy with history of pre-term labor, third trimester: Secondary | ICD-10-CM

## 2016-08-10 DIAGNOSIS — O09899 Supervision of other high risk pregnancies, unspecified trimester: Secondary | ICD-10-CM

## 2016-08-10 DIAGNOSIS — Z98891 History of uterine scar from previous surgery: Secondary | ICD-10-CM

## 2016-08-10 DIAGNOSIS — O09893 Supervision of other high risk pregnancies, third trimester: Secondary | ICD-10-CM

## 2016-08-10 DIAGNOSIS — O99213 Obesity complicating pregnancy, third trimester: Secondary | ICD-10-CM

## 2016-08-10 DIAGNOSIS — O34219 Maternal care for unspecified type scar from previous cesarean delivery: Secondary | ICD-10-CM

## 2016-08-10 DIAGNOSIS — E669 Obesity, unspecified: Secondary | ICD-10-CM

## 2016-08-10 DIAGNOSIS — O133 Gestational [pregnancy-induced] hypertension without significant proteinuria, third trimester: Secondary | ICD-10-CM

## 2016-08-10 NOTE — Progress Notes (Signed)
Pt denies any headache, visual changes, or edema at this time.

## 2016-08-10 NOTE — Patient Instructions (Signed)
Hypertension During Pregnancy °Hypertension, commonly called high blood pressure, is when the force of blood pumping through your arteries is too strong. Arteries are blood vessels that carry blood from the heart throughout the body. Hypertension during pregnancy can cause problems for you and your baby. Your baby may be born early (prematurely) or may not weigh as much as he or she should at birth. Very bad cases of hypertension during pregnancy can be life-threatening. °Different types of hypertension can occur during pregnancy. These include: °· Chronic hypertension. This happens when: °¨ You have hypertension before pregnancy and it continues during pregnancy. °¨ You develop hypertension before you are [redacted] weeks pregnant, and it continues during pregnancy. °· Gestational hypertension. This is hypertension that develops after the 20th week of pregnancy. °· Preeclampsia, also called toxemia of pregnancy. This is a very serious type of hypertension that develops only during pregnancy. It affects the whole body, and it can be very dangerous for you and your baby. °Gestational hypertension and preeclampsia usually go away within 6 weeks after your baby is born. Women who have hypertension during pregnancy have a greater chance of developing hypertension later in life or during future pregnancies. °What are the causes? °The exact cause of hypertension is not known. °What increases the risk? °There are certain factors that make it more likely for you to develop hypertension during pregnancy. These include: °· Having hypertension during a previous pregnancy or prior to pregnancy. °· Being overweight. °· Being older than age 40. °· Being pregnant for the first time or being pregnant with more than one baby. °· Becoming pregnant using fertilization methods such as IVF (in vitro fertilization). °· Having diabetes, kidney problems, or systemic lupus erythematosus. °· Having a family history of hypertension. °What are the  signs or symptoms? °Chronic hypertension and gestational hypertension rarely cause symptoms. Preeclampsia causes symptoms, which may include: °· Increased protein in your urine. Your health care provider will check for this at every visit before you give birth (prenatal visit). °· Severe headaches. °· Sudden weight gain. °· Swelling of the hands, face, legs, and feet. °· Nausea and vomiting. °· Vision problems, such as blurred or double vision. °· Numbness in the face, arms, legs, and feet. °· Dizziness. °· Slurred speech. °· Sensitivity to bright lights. °· Abdominal pain. °· Convulsions. °How is this diagnosed? °You may be diagnosed with hypertension during a routine prenatal exam. At each prenatal visit, you may: °· Have a urine test to check for high amounts of protein in your urine. °· Have your blood pressure checked. A blood pressure reading is recorded as two numbers, such as "120 over 80" (or 120/80). The first ("top") number is called the systolic pressure. It is a measure of the pressure in your arteries when your heart beats. The second ("bottom") number is called the diastolic pressure. It is a measure of the pressure in your arteries as your heart relaxes between beats. Blood pressure is measured in a unit called mm Hg. A normal blood pressure reading is: °¨ Systolic: below 120. °¨ Diastolic: below 80. °The type of hypertension that you are diagnosed with depends on your test results and when your symptoms developed. °· Chronic hypertension is usually diagnosed before 20 weeks of pregnancy. °· Gestational hypertension is usually diagnosed after 20 weeks of pregnancy. °· Hypertension with high amounts of protein in the urine is diagnosed as preeclampsia. °· Blood pressure measurements that stay above 160 systolic, or above 110 diastolic, are signs of severe preeclampsia. °  How is this treated? °Treatment for hypertension during pregnancy varies depending on the type of hypertension you have and how  serious it is. °· If you take medicines called ACE inhibitors to treat chronic hypertension, you may need to switch medicines. ACE inhibitors should not be taken during pregnancy. °· If you have gestational hypertension, you may need to take blood pressure medicine. °· If you are at risk for preeclampsia, your health care provider may recommend that you take a low-dose aspirin every day to prevent high blood pressure during your pregnancy. °· If you have severe preeclampsia, you may need to be hospitalized so you and your baby can be monitored closely. You may also need to take medicine (magnesium sulfate) to prevent seizures and to lower blood pressure. This medicine may be given as an injection or through an IV tube. °· In some cases, if your condition gets worse, you may need to deliver your baby early. °Follow these instructions at home: °Eating and drinking °· Drink enough fluid to keep your urine clear or pale yellow. °· Eat a healthy diet that is low in salt (sodium). Do not add salt to your food. Check food labels to see how much sodium a food or beverage contains. °Lifestyle °· Do not use any products that contain nicotine or tobacco, such as cigarettes and e-cigarettes. If you need help quitting, ask your health care provider. °· Do not use alcohol. °· Avoid caffeine. °· Avoid stress as much as possible. Rest and get plenty of sleep. °General instructions °· Take over-the-counter and prescription medicines only as told by your health care provider. °· While lying down, lie on your left side. This keeps pressure off your baby. °· While sitting or lying down, raise (elevate) your feet. Try putting some pillows under your lower legs. °· Exercise regularly. Ask your health care provider what kinds of exercise are best for you. °· Keep all prenatal and follow-up visits as told by your health care provider. This is important. °Contact a health care provider if: °· You have symptoms that your health care provider  told you may require more treatment or monitoring, such as: °¨ Fever. °¨ Vomiting. °¨ Headache. °Get help right away if: °· You have severe abdominal pain or vomiting that does not get better with treatment. °· You suddenly develop swelling in your hands, ankles, or face. °· You gain 4 lbs (1.8 kg) or more in 1 week. °· You develop vaginal bleeding, or you have blood in your urine. °· You do not feel your baby moving as much as usual. °· You have blurred or double vision. °· You have muscle twitching or sudden tightening (spasms). °· You have shortness of breath. °· Your lips or fingernails turn blue. °This information is not intended to replace advice given to you by your health care provider. Make sure you discuss any questions you have with your health care provider. °Document Released: 03/22/2011 Document Revised: 01/22/2016 Document Reviewed: 12/18/2015 °Elsevier Interactive Patient Education © 2017 [employer] ° °

## 2016-08-10 NOTE — Progress Notes (Signed)
   PRENATAL VISIT NOTE  Subjective:  Colleen Nicholson is a 21 y.o. G2P0101 at 4216w0d being seen today for ongoing prenatal care.  She is currently monitored for the following issues for this high-risk pregnancy and has Family history of neurofibromatosis, type 1 (von Recklinghausen's disease); BMI 38.0-38.9,adult; Smoker; History of gestational hypertension; History of placenta abruption; History of low transverse cesarean section; History of preterm delivery, currently pregnant in third trimester; Supervision of other high risk pregnancy, antepartum; Obesity in pregnancy; and Short interval between pregnancies affecting pregnancy in third trimester, antepartum on her problem list.  Patient reports contractions since last week, intermittent.  Contractions: Not present. Vag. Bleeding: None.  Movement: Present. Denies leaking of fluid.   The following portions of the patient's history were reviewed and updated as appropriate: allergies, current medications, past family history, past medical history, past social history, past surgical history and problem list. Problem list updated.  Objective:   Vitals:   08/10/16 1433  BP: (!) 144/79  Pulse: (!) 117  Weight: 255 lb (115.7 kg)    Fetal Status: Fetal Heart Rate (bpm): 128 Fundal Height: 38 cm Movement: Present  Presentation: Vertex  General:  Alert, oriented and cooperative. Patient is in no acute distress.  Skin: Skin is warm and dry. No rash noted.   Cardiovascular: Normal heart rate noted  Respiratory: Normal respiratory effort, no problems with respiration noted  Abdomen: Soft, gravid, appropriate for gestational age. Pain/Pressure: Present     Pelvic:  Cervical exam performed Dilation: 3.5 Effacement (%): 80 Station: -3  Extremities: Normal range of motion.  Edema: None  Mental Status: Normal mood and affect. Normal behavior. Normal judgment and thought content.   Assessment and Plan:  Pregnancy: G2P0101 at 7816w0d  1. History of low  transverse cesarean section Planning VBAC, signed consent  2. History of preterm delivery, currently pregnant in third trimester S/p 17 P  3. Supervision of other high risk pregnancy, antepartum UTD with prenatal care Discussed safety of sex in pregnancy  4. Short interval between pregnancies affecting pregnancy in third trimester, antepartum  5. Obesity in pregnancy 25 lb (11.3 kg) = TWG which is above goal.   6. Elevated blood pressure affecting pregnancy in third trimester, antepartum Denies HA, blurry vision, RUQ pain, edema.  She is using tylenol PM for sleep but not HA treatment. H/o gHTN with abruption at 28 week. Slowly increasing blood pressure with two values above 140/90 today ( 144/79 and 150/88). Discussed with patient risk of PIH. No sx today and cervix is favorable for IOL if needed.  - Protein / creatinine ratio, urine - Comprehensive metabolic panel - CBC - RTC in 1 day for BP check and possible scheduling of IOL  Term labor symptoms and general obstetric precautions including but not limited to vaginal bleeding, contractions, leaking of fluid and fetal movement were reviewed in detail with the patient. Please refer to After Visit Summary for other counseling recommendations.   Return in about 1 day (around 08/11/2016), or Blood pressure check and result review.   Federico FlakeKimberly Niles Horald Birky, MD

## 2016-08-11 ENCOUNTER — Ambulatory Visit: Payer: Medicaid Other | Admitting: *Deleted

## 2016-08-11 VITALS — BP 134/84

## 2016-08-11 DIAGNOSIS — R030 Elevated blood-pressure reading, without diagnosis of hypertension: Secondary | ICD-10-CM

## 2016-08-11 LAB — COMPREHENSIVE METABOLIC PANEL
A/G RATIO: 1.3 (ref 1.2–2.2)
ALBUMIN: 3.6 g/dL (ref 3.5–5.5)
ALK PHOS: 219 IU/L — AB (ref 39–117)
ALT: 10 IU/L (ref 0–32)
AST: 14 IU/L (ref 0–40)
BILIRUBIN TOTAL: 0.2 mg/dL (ref 0.0–1.2)
BUN / CREAT RATIO: 10 (ref 9–23)
BUN: 5 mg/dL — ABNORMAL LOW (ref 6–20)
CHLORIDE: 106 mmol/L (ref 96–106)
CO2: 17 mmol/L — ABNORMAL LOW (ref 18–29)
Calcium: 8.6 mg/dL — ABNORMAL LOW (ref 8.7–10.2)
Creatinine, Ser: 0.49 mg/dL — ABNORMAL LOW (ref 0.57–1.00)
GFR calc non Af Amer: 140 mL/min/{1.73_m2} (ref >59)
GFR, EST AFRICAN AMERICAN: 161 mL/min/{1.73_m2} (ref >59)
GLOBULIN, TOTAL: 2.7 g/dL (ref 1.5–4.5)
Glucose: 107 mg/dL — ABNORMAL HIGH (ref 65–99)
POTASSIUM: 3.6 mmol/L (ref 3.5–5.2)
SODIUM: 139 mmol/L (ref 134–144)
TOTAL PROTEIN: 6.3 g/dL (ref 6.0–8.5)

## 2016-08-11 LAB — PROTEIN / CREATININE RATIO, URINE
CREATININE, UR: 187.9 mg/dL
Protein, Ur: 30.4 mg/dL
Protein/Creat Ratio: 162 mg/g creat (ref 0–200)

## 2016-08-11 LAB — CBC
Hematocrit: 39.6 % (ref 34.0–46.6)
Hemoglobin: 13.6 g/dL (ref 11.1–15.9)
MCH: 30.8 pg (ref 26.6–33.0)
MCHC: 34.3 g/dL (ref 31.5–35.7)
MCV: 90 fL (ref 79–97)
PLATELETS: 400 10*3/uL — AB (ref 150–379)
RBC: 4.42 x10E6/uL (ref 3.77–5.28)
RDW: 13.4 % (ref 12.3–15.4)
WBC: 13.6 10*3/uL — AB (ref 3.4–10.8)

## 2016-08-11 NOTE — Progress Notes (Signed)
Pt is currently [redacted] wks pregnant here for a BP check due to BP = 144/79 at prenatal appt yesterday.  BP = 134/84, pt denies headache, visual changes, or edema.  Spoke to Dr Alysia PennaErvin who reviewed labs and BP and recommended growth US for isolated elevated BP before next appt on Wednesday.  Reviewed pre eclampsia precautions. US scheduled.

## 2016-08-12 ENCOUNTER — Ambulatory Visit (HOSPITAL_COMMUNITY)
Admission: RE | Admit: 2016-08-12 | Discharge: 2016-08-12 | Disposition: A | Discharge status: Home / Self Care | Payer: Medicaid Other | Source: Ambulatory Visit | Attending: Obstetrics and Gynecology | Admitting: Obstetrics and Gynecology

## 2016-08-12 ENCOUNTER — Encounter (HOSPITAL_COMMUNITY): Payer: Self-pay

## 2016-08-12 DIAGNOSIS — Z8489 Family history of other specified conditions: Secondary | ICD-10-CM | POA: Insufficient documentation

## 2016-08-12 DIAGNOSIS — Z8759 Personal history of other complications of pregnancy, childbirth and the puerperium: Secondary | ICD-10-CM

## 2016-08-12 DIAGNOSIS — O09293 Supervision of pregnancy with other poor reproductive or obstetric history, third trimester: Secondary | ICD-10-CM | POA: Insufficient documentation

## 2016-08-12 DIAGNOSIS — O09893 Supervision of other high risk pregnancies, third trimester: Secondary | ICD-10-CM

## 2016-08-12 DIAGNOSIS — O09899 Supervision of other high risk pregnancies, unspecified trimester: Secondary | ICD-10-CM

## 2016-08-12 DIAGNOSIS — R030 Elevated blood-pressure reading, without diagnosis of hypertension: Secondary | ICD-10-CM

## 2016-08-12 DIAGNOSIS — Z3A38 38 weeks gestation of pregnancy: Secondary | ICD-10-CM | POA: Insufficient documentation

## 2016-08-12 DIAGNOSIS — O09213 Supervision of pregnancy with history of pre-term labor, third trimester: Secondary | ICD-10-CM | POA: Diagnosis not present

## 2016-08-12 DIAGNOSIS — O26893 Other specified pregnancy related conditions, third trimester: Secondary | ICD-10-CM | POA: Insufficient documentation

## 2016-08-12 DIAGNOSIS — Z98891 History of uterine scar from previous surgery: Secondary | ICD-10-CM

## 2016-08-12 DIAGNOSIS — O34219 Maternal care for unspecified type scar from previous cesarean delivery: Secondary | ICD-10-CM | POA: Insufficient documentation

## 2016-08-15 ENCOUNTER — Encounter (HOSPITAL_COMMUNITY): Payer: Self-pay | Admitting: *Deleted

## 2016-08-15 ENCOUNTER — Inpatient Hospital Stay (EMERGENCY_DEPARTMENT_HOSPITAL)
Admission: AD | Admit: 2016-08-15 | Discharge: 2016-08-15 | Disposition: A | Discharge status: Home / Self Care | Payer: Medicaid Other | Source: Ambulatory Visit | Attending: Family Medicine | Admitting: Family Medicine

## 2016-08-15 DIAGNOSIS — O479 False labor, unspecified: Secondary | ICD-10-CM

## 2016-08-15 DIAGNOSIS — O09893 Supervision of other high risk pregnancies, third trimester: Secondary | ICD-10-CM

## 2016-08-15 DIAGNOSIS — O99213 Obesity complicating pregnancy, third trimester: Secondary | ICD-10-CM

## 2016-08-15 DIAGNOSIS — O99333 Smoking (tobacco) complicating pregnancy, third trimester: Secondary | ICD-10-CM | POA: Insufficient documentation

## 2016-08-15 DIAGNOSIS — O133 Gestational [pregnancy-induced] hypertension without significant proteinuria, third trimester: Secondary | ICD-10-CM

## 2016-08-15 DIAGNOSIS — Z98891 History of uterine scar from previous surgery: Secondary | ICD-10-CM

## 2016-08-15 DIAGNOSIS — O09213 Supervision of pregnancy with history of pre-term labor, third trimester: Secondary | ICD-10-CM

## 2016-08-15 DIAGNOSIS — Z8759 Personal history of other complications of pregnancy, childbirth and the puerperium: Secondary | ICD-10-CM

## 2016-08-15 DIAGNOSIS — Z3A38 38 weeks gestation of pregnancy: Secondary | ICD-10-CM

## 2016-08-15 DIAGNOSIS — O471 False labor at or after 37 completed weeks of gestation: Secondary | ICD-10-CM | POA: Diagnosis not present

## 2016-08-15 DIAGNOSIS — Z6838 Body mass index (BMI) 38.0-38.9, adult: Secondary | ICD-10-CM

## 2016-08-15 DIAGNOSIS — F17210 Nicotine dependence, cigarettes, uncomplicated: Secondary | ICD-10-CM

## 2016-08-15 DIAGNOSIS — E669 Obesity, unspecified: Secondary | ICD-10-CM | POA: Insufficient documentation

## 2016-08-15 DIAGNOSIS — O09899 Supervision of other high risk pregnancies, unspecified trimester: Secondary | ICD-10-CM

## 2016-08-15 LAB — COMPREHENSIVE METABOLIC PANEL
ALK PHOS: 203 U/L — AB (ref 38–126)
ALT: 12 U/L — AB (ref 14–54)
AST: 18 U/L (ref 15–41)
Albumin: 2.9 g/dL — ABNORMAL LOW (ref 3.5–5.0)
Anion gap: 9 (ref 5–15)
BILIRUBIN TOTAL: 0.3 mg/dL (ref 0.3–1.2)
BUN: 6 mg/dL (ref 6–20)
CALCIUM: 8.1 mg/dL — AB (ref 8.9–10.3)
CHLORIDE: 107 mmol/L (ref 101–111)
CO2: 20 mmol/L — ABNORMAL LOW (ref 22–32)
CREATININE: 0.49 mg/dL (ref 0.44–1.00)
Glucose, Bld: 101 mg/dL — ABNORMAL HIGH (ref 65–99)
Potassium: 3.3 mmol/L — ABNORMAL LOW (ref 3.5–5.1)
Sodium: 136 mmol/L (ref 135–145)
TOTAL PROTEIN: 6.5 g/dL (ref 6.5–8.1)

## 2016-08-15 LAB — CBC
HEMATOCRIT: 37.5 % (ref 36.0–46.0)
HEMOGLOBIN: 12.9 g/dL (ref 12.0–15.0)
MCH: 30.6 pg (ref 26.0–34.0)
MCHC: 34.4 g/dL (ref 30.0–36.0)
MCV: 89.1 fL (ref 78.0–100.0)
PLATELETS: 363 10*3/uL (ref 150–400)
RBC: 4.21 MIL/uL (ref 3.87–5.11)
RDW: 13.6 % (ref 11.5–15.5)
WBC: 14 10*3/uL — AB (ref 4.0–10.5)

## 2016-08-15 LAB — PROTEIN / CREATININE RATIO, URINE
CREATININE, URINE: 185 mg/dL
Protein Creatinine Ratio: 0.14 mg/mg{Cre} (ref 0.00–0.15)
TOTAL PROTEIN, URINE: 26 mg/dL

## 2016-08-15 LAB — URINALYSIS, ROUTINE W REFLEX MICROSCOPIC
BACTERIA UA: NONE SEEN
BILIRUBIN URINE: NEGATIVE
Glucose, UA: NEGATIVE mg/dL
Hgb urine dipstick: NEGATIVE
Ketones, ur: NEGATIVE mg/dL
Nitrite: NEGATIVE
PH: 6 (ref 5.0–8.0)
Protein, ur: 30 mg/dL — AB
SPECIFIC GRAVITY, URINE: 1.02 (ref 1.005–1.030)

## 2016-08-15 NOTE — Discharge Instructions (Signed)
Third Trimester of Pregnancy The third trimester is from week 29 through week 40 (months 7 through 9). The third trimester is a time when the unborn baby (fetus) is growing rapidly. At the end of the ninth month, the fetus is about 20 inches in length and weighs 6-10 pounds. Body changes during your third trimester Your body goes through many changes during pregnancy. The changes vary from woman to woman. During the third trimester:  Your weight will continue to increase. You can expect to gain 25-35 pounds (11-16 kg) by the end of the pregnancy.  You may begin to get stretch marks on your hips, abdomen, and breasts.  You may urinate more often because the fetus is moving lower into your pelvis and pressing on your bladder.  You may develop or continue to have heartburn. This is caused by increased hormones that slow down muscles in the digestive tract.  You may develop or continue to have constipation because increased hormones slow digestion and cause the muscles that push waste through your intestines to relax.  You may develop hemorrhoids. These are swollen veins (varicose veins) in the rectum that can itch or be painful.  You may develop swollen, bulging veins (varicose veins) in your legs.  You may have increased body aches in the pelvis, back, or thighs. This is due to weight gain and increased hormones that are relaxing your joints.  You may have changes in your hair. These can include thickening of your hair, rapid growth, and changes in texture. Some women also have hair loss during or after pregnancy, or hair that feels dry or thin. Your hair will most likely return to normal after your baby is born.  Your breasts will continue to grow and they will continue to become tender. A yellow fluid (colostrum) may leak from your breasts. This is the first milk you are producing for your baby.  Your belly button may stick out.  You may notice more swelling in your hands, face, or  ankles.  You may have increased tingling or numbness in your hands, arms, and legs. The skin on your belly may also feel numb.  You may feel short of breath because of your expanding uterus.  You may have more problems sleeping. This can be caused by the size of your belly, increased need to urinate, and an increase in your body's metabolism.  You may notice the fetus "dropping," or moving lower in your abdomen.  You may have increased vaginal discharge.  Your cervix becomes thin and soft (effaced) near your due date. What to expect at prenatal visits You will have prenatal exams every 2 weeks until week 36. Then you will have weekly prenatal exams. During a routine prenatal visit:  You will be weighed to make sure you and the fetus are growing normally.  Your blood pressure will be taken.  Your abdomen will be measured to track your baby's growth.  The fetal heartbeat will be listened to.  Any test results from the previous visit will be discussed.  You may have a cervical check near your due date to see if you have effaced. At around 36 weeks, your health care provider will check your cervix. At the same time, your health care provider will also perform a test on the secretions of the vaginal tissue. This test is to determine if a type of bacteria, Group B streptococcus, is present. Your health care provider will explain this further. Your health care provider may ask you:    What your birth plan is.  How you are feeling.  If you are feeling the baby move.  If you have had any abnormal symptoms, such as leaking fluid, bleeding, severe headaches, or abdominal cramping.  If you are using any tobacco products, including cigarettes, chewing tobacco, and electronic cigarettes.  If you have any questions. Other tests or screenings that may be performed during your third trimester include:  Blood tests that check for low iron levels (anemia).  Fetal testing to check the health,  activity level, and growth of the fetus. Testing is done if you have certain medical conditions or if there are problems during the pregnancy.  Nonstress test (NST). This test checks the health of your baby to make sure there are no signs of problems, such as the baby not getting enough oxygen. During this test, a belt is placed around your belly. The baby is made to move, and its heart rate is monitored during movement. What is false labor? False labor is a condition in which you feel small, irregular tightenings of the muscles in the womb (contractions) that eventually go away. These are called Braxton Hicks contractions. Contractions may last for hours, days, or even weeks before true labor sets in. If contractions come at regular intervals, become more frequent, increase in intensity, or become painful, you should see your health care provider. What are the signs of labor?  Abdominal cramps.  Regular contractions that start at 10 minutes apart and become stronger and more frequent with time.  Contractions that start on the top of the uterus and spread down to the lower abdomen and back.  Increased pelvic pressure and dull back pain.  A watery or bloody mucus discharge that comes from the vagina.  Leaking of amniotic fluid. This is also known as your "water breaking." It could be a slow trickle or a gush. Let your doctor know if it has a color or strange odor. If you have any of these signs, call your health care provider right away, even if it is before your due date. Follow these instructions at home: Eating and drinking  Continue to eat regular, healthy meals.  Do not eat:  Raw meat or meat spreads.  Unpasteurized milk or cheese.  Unpasteurized juice.  Store-made salad.  Refrigerated smoked seafood.  Hot dogs or deli meat, unless they are piping hot.  More than 6 ounces of albacore tuna a week.  Shark, swordfish, king mackerel, or tile fish.  Store-made salads.  Raw  sprouts, such as mung bean or alfalfa sprouts.  Take prenatal vitamins as told by your health care provider.  Take 1000 mg of calcium daily as told by your health care provider.  If you develop constipation:  Take over-the-counter or prescription medicines.  Drink enough fluid to keep your urine clear or pale yellow.  Eat foods that are high in fiber, such as fresh fruits and vegetables, whole grains, and beans.  Limit foods that are high in fat and processed sugars, such as fried and sweet foods. Activity  Exercise only as directed by your health care provider. Healthy pregnant women should aim for 2 hours and 30 minutes of moderate exercise per week. If you experience any pain or discomfort while exercising, stop.  Avoid heavy lifting.  Do not exercise in extreme heat or humidity, or at high altitudes.  Wear low-heel, comfortable shoes.  Practice good posture.  Do not travel far distances unless it is absolutely necessary and only with the approval   of your health care provider.  Wear your seat belt at all times while in a car, on a bus, or on a plane.  Take frequent breaks and rest with your legs elevated if you have leg cramps or low back pain.  Do not use hot tubs, steam rooms, or saunas.  You may continue to have sex unless your health care provider tells you otherwise. Lifestyle  Do not use any products that contain nicotine or tobacco, such as cigarettes and e-cigarettes. If you need help quitting, ask your health care provider.  Do not drink alcohol.  Do not use any medicinal herbs or unprescribed drugs. These chemicals affect the formation and growth of the baby.  If you develop varicose veins:  Wear support pantyhose or compression stockings as told by your healthcare provider.  Elevate your feet for 15 minutes, 3-4 times a day.  Wear a supportive maternity bra to help with breast tenderness. General instructions  Take over-the-counter and prescription  medicines only as told by your health care provider. There are medicines that are either safe or unsafe to take during pregnancy.  Take warm sitz baths to soothe any pain or discomfort caused by hemorrhoids. Use hemorrhoid cream or witch hazel if your health care provider approves.  Avoid cat litter boxes and soil used by cats. These carry germs that can cause birth defects in the baby. If you have a cat, ask someone to clean the litter box for you.  To prepare for the arrival of your baby:  Take prenatal classes to understand, practice, and ask questions about the labor and delivery.  Make a trial run to the hospital.  Visit the hospital and tour the maternity area.  Arrange for maternity or paternity leave through employers.  Arrange for family and friends to take care of pets while you are in the hospital.  Purchase a rear-facing car seat and make sure you know how to install it in your car.  Pack your hospital bag.  Prepare the baby's nursery. Make sure to remove all pillows and stuffed animals from the baby's crib to prevent suffocation.  Visit your dentist if you have not gone during your pregnancy. Use a soft toothbrush to brush your teeth and be gentle when you floss.  Keep all prenatal follow-up visits as told by your health care provider. This is important. Contact a health care provider if:  You are unsure if you are in labor or if your water has broken.  You become dizzy.  You have mild pelvic cramps, pelvic pressure, or nagging pain in your abdominal area.  You have lower back pain.  You have persistent nausea, vomiting, or diarrhea.  You have an unusual or bad smelling vaginal discharge.  You have pain when you urinate. Get help right away if:  You have a fever.  You are leaking fluid from your vagina.  You have spotting or bleeding from your vagina.  You have severe abdominal pain or cramping.  You have rapid weight loss or weight gain.  You have  shortness of breath with chest pain.  You notice sudden or extreme swelling of your face, hands, ankles, feet, or legs.  Your baby makes fewer than 10 movements in 2 hours.  You have severe headaches that do not go away with medicine.  You have vision changes. Summary  The third trimester is from week 29 through week 40, months 7 through 9. The third trimester is a time when the unborn baby (fetus)   is growing rapidly.  During the third trimester, your discomfort may increase as you and your baby continue to gain weight. You may have abdominal, leg, and back pain, sleeping problems, and an increased need to urinate.  During the third trimester your breasts will keep growing and they will continue to become tender. A yellow fluid (colostrum) may leak from your breasts. This is the first milk you are producing for your baby.  False labor is a condition in which you feel small, irregular tightenings of the muscles in the womb (contractions) that eventually go away. These are called Braxton Hicks contractions. Contractions may last for hours, days, or even weeks before true labor sets in.  Signs of labor can include: abdominal cramps; regular contractions that start at 10 minutes apart and become stronger and more frequent with time; watery or bloody mucus discharge that comes from the vagina; increased pelvic pressure and dull back pain; and leaking of amniotic fluid. This information is not intended to replace advice given to you by your health care provider. Make sure you discuss any questions you have with your health care provider. Document Released: 06/28/2001 Document Revised: 12/10/2015 Document Reviewed: 09/04/2012 [employer] Interactive Patient Education  2017 [employer]  

## 2016-08-15 NOTE — MAU Provider Note (Signed)
Obstetric Resident MAU Note  Chief Complaint:  Contractions   None    HPI: Colleen Nicholson is a 21 y.o. G2P0101 at 109w5d who presents to maternity admissions reporting increasing frequency and intensity of contractions starting over the last couple of days.   Denies  leakage of fluid or vaginal bleeding. Good fetal movement.   Her blood pressure was noted to initially be elevated to the 140s/90s in the MAU. She reports having a blood pressure elevated to similar range in clinic one week ago. It was an isolated blood pressure. She reports over the past couple of days having a frontal headache. No blurry vision or other vision changes. No shortness of breath. No worsening lower extremity or upper extremity edema. No RUQ abdominal pain.   Of note, her history is remarkable for previous placental abruption and preterm labor resulting in LTCS. She also had gestational hypertension, but has not required any medications since her previous pregnancy.   Pregnancy Course: Receives care at [facility] Patient Active Problem List   Diagnosis Date Noted  . Obesity in pregnancy 07/13/2016  . Short interval between pregnancies affecting pregnancy in third trimester, antepartum 07/13/2016  . History of low transverse cesarean section 02/21/2016  . History of preterm delivery, currently pregnant in third trimester 02/21/2016  . Supervision of other high risk pregnancy, antepartum 02/21/2016  . History of gestational hypertension 02/11/2016  . History of placenta abruption 02/11/2016  . BMI 38.0-38.9,adult 07/21/2015  . Smoker 07/21/2015  . Family history of neurofibromatosis, type 1 (von Recklinghausen's disease) 03/16/2015    Past Medical History:  Diagnosis Date  . Amenorrhea   . History of gestational hypertension 02/11/2016   Baseline P:C ratio 59  . History of low transverse cesarean section 02/21/2016  . History of placenta abruption 02/11/2016  . History of preterm delivery, currently pregnant in  second trimester 02/21/2016  . Medical history non-contributory   . Obesity   . Pregnancy induced hypertension     OB History  Gravida Para Term Preterm AB Living  2 1   1   1   SAB TAB Ectopic Multiple Live Births          1    # Outcome Date GA Lbr Len/2nd Weight Sex Delivery Anes PTL Lv  2 Current           1 Preterm 07/25/15 [redacted]w[redacted]d   F CS-Unspec   LIV      Past Surgical History:  Procedure Laterality Date  . CESAREAN SECTION      Family History: History reviewed. No pertinent family history.  Social History: Social History  Substance Use Topics  . Smoking status: Current Every Day Smoker    Packs/day: 1.00    Types: Cigarettes  . Smokeless tobacco: Never Used  . Alcohol use No    Allergies: No Known Allergies  Facility-Administered Medications Prior to Admission  Medication Dose Route Frequency Provider Last Rate Last Dose  . hydroxyprogesterone caproate (MAKENA) 250 mg/mL injection 250 mg  250 mg Intramuscular Q7 days [place], CNM   250 mg at 07/27/16 1502   Prescriptions Prior to Admission  Medication Sig Dispense Refill Last Dose  . acetaminophen (TYLENOL) 325 MG tablet Take 650 mg by mouth every 6 (six) hours as needed for moderate pain or headache.    08/14/2016 at Unknown time  . pantoprazole (PROTONIX) 40 MG tablet Take 1 tablet (40 mg total) by mouth daily. 60 tablet 0 08/15/2016 at Unknown time  . Prenatal Multivit-Min-Fe-FA (  PRENATAL VITAMINS) 0.8 MG tablet Take 1 tablet by mouth daily. 30 tablet 0 Past Week at Unknown time  . aspirin EC 81 MG tablet Take 1 tablet (81 mg total) by mouth daily. Take after 12 weeks for prevention of preeclampssia later in pregnancy (Patient not taking: Reported on 07/27/2016) 300 tablet 2 Not Taking  . ranitidine (ZANTAC) 150 MG tablet Take 1 tablet (150 mg total) by mouth 2 (two) times daily. (Patient not taking: Reported on 08/12/2016) 60 tablet 4 More than a month at Unknown time    ROS: Pertinent findings in history  of present illness.  Physical Exam  Blood pressure 115/81, pulse 108, temperature 98.3 F (36.8 C), temperature source Oral, resp. rate 18, height 5\' 5"  (1.651 m), weight 255 lb (115.7 kg), last menstrual period 11/02/2015, unknown if currently breastfeeding. CONSTITUTIONAL: Well-developed, well-nourished female in no acute distress.  HENT:  Normocephalic, atraumatic, Moist mucus membranes EYES: Conjunctivae  normal.  No scleral icterus.  NECK: Normal range of motion, supple SKIN: Skin is warm and dry. No rash noted. Not diaphoretic. No erythema. No pallor. NEUROLGIC: Alert and oriented to person, place, and time. No focal defects PSYCHIATRIC: Normal mood and affect. Normal behavior. Normal judgment and thought content. CARDIOVASCULAR: Normal heart rate noted, regular rhythm RESPIRATORY: No increased work of breathing, stable on room air with LCTAB ABDOMEN: Soft, nontender, nondistended, gravid appropriate for gestational age MUSCULOSKELETAL: Normal range of motion. Trace bilateral lower extremity edema and no tenderness. 2+ distal pulses.  Dilation: 4 Effacement (%): 80, 90 Cervical Position: Posterior Station: -2 Presentation: Vertex Exam by:: K. WeissRN  FHT:  Baseline 130s , moderate variability, accelerations present, no decelerations Contractions: irregular pattern   Labs: Results for orders placed or performed during the hospital encounter of 08/15/16 (from the past 24 hour(s))  Urinalysis, Routine w reflex microscopic     Status: Abnormal   Collection Time: 08/15/16  8:09 PM  Result Value Ref Range   Color, Urine YELLOW YELLOW   APPearance CLOUDY (A) CLEAR   Specific Gravity, Urine 1.020 1.005 - 1.030   pH 6.0 5.0 - 8.0   Glucose, UA NEGATIVE NEGATIVE mg/dL   Hgb urine dipstick NEGATIVE NEGATIVE   Bilirubin Urine NEGATIVE NEGATIVE   Ketones, ur NEGATIVE NEGATIVE mg/dL   Protein, ur 30 (A) NEGATIVE mg/dL   Nitrite NEGATIVE NEGATIVE   Leukocytes, UA MODERATE (A)  NEGATIVE   RBC / HPF 0-5 0 - 5 RBC/hpf   WBC, UA TOO NUMEROUS TO COUNT 0 - 5 WBC/hpf   Bacteria, UA NONE SEEN NONE SEEN   Squamous Epithelial / LPF TOO NUMEROUS TO COUNT (A) NONE SEEN   Mucous PRESENT   Protein / creatinine ratio, urine     Status: None   Collection Time: 08/15/16  8:09 PM  Result Value Ref Range   Creatinine, Urine 185.00 mg/dL   Total Protein, Urine 26 mg/dL   Protein Creatinine Ratio 0.14 0.00 - 0.15 mg/mg[Cre]  CBC     Status: Abnormal   Collection Time: 08/15/16  8:50 PM  Result Value Ref Range   WBC 14.0 (H) 4.0 - 10.5 K/uL   RBC 4.21 3.87 - 5.11 MIL/uL   Hemoglobin 12.9 12.0 - 15.0 g/dL   HCT 16.1 09.6 - 04.5 %   MCV 89.1 78.0 - 100.0 fL   MCH 30.6 26.0 - 34.0 pg   MCHC 34.4 30.0 - 36.0 g/dL   RDW 40.9 81.1 - 91.4 %   Platelets 363 150 - 400 K/uL  Comprehensive metabolic panel     Status: Abnormal   Collection Time: 08/15/16  8:50 PM  Result Value Ref Range   Sodium 136 135 - 145 mmol/L   Potassium 3.3 (L) 3.5 - 5.1 mmol/L   Chloride 107 101 - 111 mmol/L   CO2 20 (L) 22 - 32 mmol/L   Glucose, Bld 101 (H) 65 - 99 mg/dL   BUN 6 6 - 20 mg/dL   Creatinine, Ser 1.61 0.44 - 1.00 mg/dL   Calcium 8.1 (L) 8.9 - 10.3 mg/dL   Total Protein 6.5 6.5 - 8.1 g/dL   Albumin 2.9 (L) 3.5 - 5.0 g/dL   AST 18 15 - 41 U/L   ALT 12 (L) 14 - 54 U/L   Alkaline Phosphatase 203 (H) 38 - 126 U/L   Total Bilirubin 0.3 0.3 - 1.2 mg/dL   GFR calc non Af Amer >60 >60 mL/min   GFR calc Af Amer >60 >60 mL/min   Anion gap 9 5 - 15    Imaging:  [place] Mfm Ob Follow Up  Result Date: 08/12/2016 ----------------------------------------------------------------------  OBSTETRICS REPORT                      (Signed Final 08/12/2016 04:10 pm) ---------------------------------------------------------------------- Patient Info  ID #:       [id number]                         D.O.B.:   October 18, 1995 (20 yrs)  Name:       Colleen Nicholson                Visit Date:  08/12/2016 03:52 pm  ---------------------------------------------------------------------- Performed By  Performed By:     Tommi Emery         Secondary Phy.:   [facility]                                                             [facility]  Attending:        Charlsie Merles MD         Address:          [address]  Referred By:      Dorathy Kinsman         Location:         [facility]                    CNM  Ref. Address:     [address]                    [place]                    [phone] ---------------------------------------------------------------------- Orders   #  Description                                 Code   1  [place] MFM OB FOLLOW UP                         60454.09  ----------------------------------------------------------------------   #  Ordered By               Order #        Accession #    Episode #   1  Nettie Elm            [id number]      [id number]     [id number]  ---------------------------------------------------------------------- Indications   [redacted] weeks gestation of pregnancy                Z3A.38   Poor obstetric history: Previous               O09.299   preeclampsia / eclampsia/gestational HTN   Hypertension - Gestational                     O13.9   History of cesarean delivery, currently        O34.219   pregnant   Poor obstetric history: Previous preterm       O09.219   delivery, antepartum (28w 1d)   Family history of genetic disorder (Partner    [phone]   with Neurofibromatosis Type 1)  ---------------------------------------------------------------------- OB History  Blood Type:            Height:  5'5"   Weight (lb):  238      BMI:   39.6  Gravidity:    2         Term:   0        Prem:   1        SAB:   0  TOP:          0       Ectopic:   0        Living: 1 ---------------------------------------------------------------------- Fetal Evaluation  Num Of Fetuses:     1  Fetal Heart         146  Rate(bpm):  Cardiac Activity:   Observed  Presentation:       Cephalic  Placenta:           Anterior, above cervical os  P. Cord Insertion:  Previously Visualized  Amniotic Fluid  AFI FV:      Subjectively within normal limits  AFI Sum(cm)     %Tile       Largest Pocket(cm)  16.53  65          5.41  RUQ(cm)       RLQ(cm)       LUQ(cm)        LLQ(cm)  5.41          2.66          4.55           3.91 ---------------------------------------------------------------------- Biometry  BPD:        91  mm     G. Age:  36w 6d         38  %    CI:        79.85   %   70 - 86                                                          FL/HC:      19.6   %   20.9 - 22.7  HC:      321.8  mm     G. Age:  36w 3d          4  %    HC/AC:      0.90       0.92 - 1.05  AC:      358.7  mm     G. Age:  39w 5d         94  %    FL/BPD:     69.2   %   71 - 87  FL:         63  mm     G. Age:  32w 4d        < 3  %    FL/AC:      17.6   %   20 - 24  HUM:      60.9  mm     G. Age:  35w 2d         20  %  Est. FW:    3232  gm      7 lb 2 oz     64  % ---------------------------------------------------------------------- Gestational Age  LMP:           40w 4d       Date:   11/02/15                 EDD:   08/08/16  U/S Today:     36w 3d                                        EDD:   09/06/16  Best:          38w 2d    Det. ByMarcella Dubs         EDD:   08/24/16                                      (02/11/16) ---------------------------------------------------------------------- Anatomy  Cranium:               Appears normal         Aortic Arch:  Previously seen  Cavum:                 Previously seen        Ductal Arch:            Previously seen  Ventricles:            Appears normal         Diaphragm:              Appears normal  Choroid Plexus:        Previously seen         Stomach:                Appears normal, left                                                                        sided  Cerebellum:            Previously seen        Abdomen:                Appears normal  Posterior Fossa:       Appears normal         Abdominal Wall:         Previously seen  Nuchal Fold:           Previously seen        Cord Vessels:           Appears normal (3                                                                        vessel cord)  Face:                  Orbits nl; profile not Kidneys:                Appear normal                         well visualized  Lips:                  Previously seen        Bladder:                Appears normal  Thoracic:              Appears normal         Spine:                  Previously seen  Heart:                 Previously seen        Upper Extremities:      Previously seen  RVOT:                  Previously seen        Lower Extremities:  Previously seen  LVOT:                  Previously seen  Other:  Female gender previously seen. Technically difficult due to maternal          habitus and fetal position. ---------------------------------------------------------------------- Cervix Uterus Adnexa  Cervix  Not visualized (advanced GA >29wks)  Uterus  No abnormality visualized.  Left Ovary  Not visualized. No adnexal mass visualized.  Right Ovary  Not visualized. No adnexal mass visualized. ---------------------------------------------------------------------- Impression  Singleton intrauterine pregnancy at 38+2 weeks. recent  diagnosis of gestational HTN  Review of the anatomy shows no sonographic markers for  aneuploidy or structural anomalies  Amniotic fluid volume is normal with an AFI of 16.5 cm  Estimated fetal weight is 3232g which is growth in the 64th  percentile ---------------------------------------------------------------------- Recommendations  No evidence of fetal compromise. Follow-up ultrasounds as  clinically indicated.  ----------------------------------------------------------------------                 Charlsie Merles, MD Electronically Signed Final Report   08/12/2016 04:10 pm ----------------------------------------------------------------------   MAU Course: Patient presented with abdominal pain, consistent with uterine contractions. She was evaluated and found to have a cervical exam that was essentially unchanged from her clinic visit last week. She was noted to have an initial blood pressure elevated to the 140s/70s. Pre-eclampsia labs were obtained given her history and presence of headache. CBC and CMP were not concerning for significant thrombocytopenia or anemia. Urine protein creatine ratio was found to be 0.14. Patient's blood pressure continued to improve and trend downward throughout her stay. Decision was made for discharge home.    Assessment: 1. Uterine contractions during pregnancy   2. History of preterm delivery, currently pregnant in third trimester   3. History of low transverse cesarean section   4. Supervision of other high risk pregnancy, antepartum   5. History of gestational hypertension     Plan: Plan for blood pressure check in clinic tomorrow If BP remains elevated, consider admitting for induction secondary to gestational hypertension Patient technically with 2 elevated blood pressures during this pregnancy, but suspect that second reading today was secondary to just being moved back to a room and initially presenting to the MAU. Given no recurrence, do not suspect that this was a truly elevated reading.  Discharge home Labor precautions reviewed Follow up with OB provider  Follow-up Information    [facility] Follow up in 1 day(s).   Specialty:  Obstetrics and Gynecology Why:  For blood pressure check Contact information: [address] [place] [address] [phone]          Allergies as of 08/15/2016   No  Known Allergies     Medication List    STOP taking these medications   aspirin EC 81 MG tablet     TAKE these medications   acetaminophen 325 MG tablet Commonly known as:  TYLENOL Take 650 mg by mouth every 6 (six) hours as needed for moderate pain or headache.   pantoprazole 40 MG tablet Commonly known as:  PROTONIX Take 1 tablet (40 mg total) by mouth daily.   Prenatal Vitamins 0.8 MG tablet Take 1 tablet by mouth daily.   ranitidine 150 MG tablet Commonly known as:  ZANTAC Take 1 tablet (150 mg total) by mouth 2 (two) times daily.       Lise Auer, MD PGY-2 08/15/2016 10:52 PM    OB  FELLOW DISCHARGE ATTESTATION  I have seen and examined this patient and agree with above documentation in the resident's note.   Ernestina Penna, MD 11:56 PM

## 2016-08-15 NOTE — MAU Note (Signed)
PT SAYS SHE HAS HAD  TIGHTENING IN ABD- X2  DAYS.   PNC-    [facility] - VE ON WED  3 -4  CM.    DENIES HSV AND MRSA.   GBS- UNSURE.     PLANS  FOR  VAG  DEL

## 2016-08-16 ENCOUNTER — Inpatient Hospital Stay (HOSPITAL_COMMUNITY): Payer: Medicaid Other | Admitting: Anesthesiology

## 2016-08-16 ENCOUNTER — Inpatient Hospital Stay (HOSPITAL_COMMUNITY)
Admission: AD | Admit: 2016-08-16 | Discharge: 2016-08-17 | DRG: 775 | Disposition: A | Discharge status: Home / Self Care | Payer: Medicaid Other | Source: Ambulatory Visit | Attending: Family Medicine | Admitting: Family Medicine

## 2016-08-16 ENCOUNTER — Encounter (HOSPITAL_COMMUNITY): Payer: Self-pay

## 2016-08-16 DIAGNOSIS — K219 Gastro-esophageal reflux disease without esophagitis: Secondary | ICD-10-CM | POA: Diagnosis present

## 2016-08-16 DIAGNOSIS — O471 False labor at or after 37 completed weeks of gestation: Secondary | ICD-10-CM | POA: Diagnosis not present

## 2016-08-16 DIAGNOSIS — O9962 Diseases of the digestive system complicating childbirth: Secondary | ICD-10-CM | POA: Diagnosis present

## 2016-08-16 DIAGNOSIS — O09899 Supervision of other high risk pregnancies, unspecified trimester: Secondary | ICD-10-CM

## 2016-08-16 DIAGNOSIS — O99214 Obesity complicating childbirth: Secondary | ICD-10-CM | POA: Diagnosis present

## 2016-08-16 DIAGNOSIS — Z6841 Body Mass Index (BMI) 40.0 and over, adult: Secondary | ICD-10-CM | POA: Diagnosis not present

## 2016-08-16 DIAGNOSIS — O26893 Other specified pregnancy related conditions, third trimester: Secondary | ICD-10-CM | POA: Diagnosis present

## 2016-08-16 DIAGNOSIS — Z3A38 38 weeks gestation of pregnancy: Secondary | ICD-10-CM

## 2016-08-16 DIAGNOSIS — Z98891 History of uterine scar from previous surgery: Secondary | ICD-10-CM

## 2016-08-16 DIAGNOSIS — O34211 Maternal care for low transverse scar from previous cesarean delivery: Secondary | ICD-10-CM | POA: Diagnosis present

## 2016-08-16 DIAGNOSIS — F17210 Nicotine dependence, cigarettes, uncomplicated: Secondary | ICD-10-CM | POA: Diagnosis present

## 2016-08-16 DIAGNOSIS — O99334 Smoking (tobacco) complicating childbirth: Secondary | ICD-10-CM | POA: Diagnosis present

## 2016-08-16 DIAGNOSIS — O09213 Supervision of pregnancy with history of pre-term labor, third trimester: Secondary | ICD-10-CM

## 2016-08-16 DIAGNOSIS — O4292 Full-term premature rupture of membranes, unspecified as to length of time between rupture and onset of labor: Secondary | ICD-10-CM | POA: Diagnosis present

## 2016-08-16 DIAGNOSIS — R030 Elevated blood-pressure reading, without diagnosis of hypertension: Secondary | ICD-10-CM | POA: Diagnosis present

## 2016-08-16 DIAGNOSIS — O09893 Supervision of other high risk pregnancies, third trimester: Secondary | ICD-10-CM

## 2016-08-16 DIAGNOSIS — Z8759 Personal history of other complications of pregnancy, childbirth and the puerperium: Secondary | ICD-10-CM

## 2016-08-16 LAB — CBC
HCT: 39.8 % (ref 36.0–46.0)
HEMOGLOBIN: 13.6 g/dL (ref 12.0–15.0)
MCH: 30.5 pg (ref 26.0–34.0)
MCHC: 34.2 g/dL (ref 30.0–36.0)
MCV: 89.2 fL (ref 78.0–100.0)
Platelets: 378 10*3/uL (ref 150–400)
RBC: 4.46 MIL/uL (ref 3.87–5.11)
RDW: 13.7 % (ref 11.5–15.5)
WBC: 15.8 10*3/uL — AB (ref 4.0–10.5)

## 2016-08-16 LAB — COMPREHENSIVE METABOLIC PANEL
ALBUMIN: 2.8 g/dL — AB (ref 3.5–5.0)
ALT: 13 U/L — ABNORMAL LOW (ref 14–54)
ANION GAP: 9 (ref 5–15)
AST: 29 U/L (ref 15–41)
Alkaline Phosphatase: 214 U/L — ABNORMAL HIGH (ref 38–126)
BUN: 5 mg/dL — ABNORMAL LOW (ref 6–20)
CALCIUM: 8.6 mg/dL — AB (ref 8.9–10.3)
CHLORIDE: 108 mmol/L (ref 101–111)
CO2: 19 mmol/L — AB (ref 22–32)
Creatinine, Ser: 0.5 mg/dL (ref 0.44–1.00)
GFR calc non Af Amer: >60 mL/min (ref >60)
GLUCOSE: 95 mg/dL (ref 65–99)
POTASSIUM: 4.4 mmol/L (ref 3.5–5.1)
SODIUM: 136 mmol/L (ref 135–145)
Total Bilirubin: 1.1 mg/dL (ref 0.3–1.2)
Total Protein: 6.1 g/dL — ABNORMAL LOW (ref 6.5–8.1)

## 2016-08-16 LAB — ABO/RH: ABO/RH(D): O POS

## 2016-08-16 LAB — TYPE AND SCREEN
ABO/RH(D): O POS
ANTIBODY SCREEN: NEGATIVE

## 2016-08-16 LAB — POCT FERN TEST: POCT FERN TEST: POSITIVE

## 2016-08-16 LAB — RPR: RPR Ser Ql: NONREACTIVE

## 2016-08-16 MED ORDER — ZOLPIDEM TARTRATE 5 MG PO TABS: 5.0000 mg | ORAL_TABLET | Freq: Every evening | ORAL | Status: DC | PRN

## 2016-08-16 MED ORDER — ONDANSETRON HCL 4 MG PO TABS: 4.0000 mg | ORAL_TABLET | ORAL | Status: DC | PRN

## 2016-08-16 MED ORDER — TERBUTALINE SULFATE 1 MG/ML IJ SOLN
0.2500 mg | Freq: Once | INTRAMUSCULAR | Status: DC | PRN
  Filled 2016-08-16: qty 1

## 2016-08-16 MED ORDER — LACTATED RINGERS IV SOLN: 500.0000 mL | INTRAVENOUS | Status: DC | PRN

## 2016-08-16 MED ORDER — DIPHENHYDRAMINE HCL 50 MG/ML IJ SOLN: 12.5000 mg | INTRAMUSCULAR | Status: DC | PRN

## 2016-08-16 MED ORDER — WITCH HAZEL-GLYCERIN EX PADS: 1.0000 "application " | MEDICATED_PAD | CUTANEOUS | Status: DC | PRN

## 2016-08-16 MED ORDER — DIBUCAINE 1 % RE OINT: 1.0000 "application " | TOPICAL_OINTMENT | RECTAL | Status: DC | PRN

## 2016-08-16 MED ORDER — EPHEDRINE 5 MG/ML INJ
10.0000 mg | INTRAVENOUS | Status: DC | PRN
  Filled 2016-08-16: qty 4

## 2016-08-16 MED ORDER — LACTATED RINGERS IV SOLN: 500.0000 mL | Freq: Once | INTRAVENOUS | Status: DC

## 2016-08-16 MED ORDER — OXYCODONE-ACETAMINOPHEN 5-325 MG PO TABS: 2.0000 | ORAL_TABLET | ORAL | Status: DC | PRN

## 2016-08-16 MED ORDER — SOD CITRATE-CITRIC ACID 500-334 MG/5ML PO SOLN: 30.0000 mL | ORAL | Status: DC | PRN

## 2016-08-16 MED ORDER — OXYTOCIN 40 UNITS IN LACTATED RINGERS INFUSION - SIMPLE MED
2.5000 [IU]/h | INTRAVENOUS | Status: DC
  Filled 2016-08-16: qty 1000

## 2016-08-16 MED ORDER — LACTATED RINGERS IV SOLN: INTRAVENOUS | Status: DC

## 2016-08-16 MED ORDER — SIMETHICONE 80 MG PO CHEW: 80.0000 mg | CHEWABLE_TABLET | ORAL | Status: DC | PRN

## 2016-08-16 MED ORDER — COCONUT OIL OIL: 1.0000 "application " | TOPICAL_OIL | Status: DC | PRN

## 2016-08-16 MED ORDER — PHENYLEPHRINE 40 MCG/ML (10ML) SYRINGE FOR IV PUSH (FOR BLOOD PRESSURE SUPPORT)
80.0000 ug | PREFILLED_SYRINGE | INTRAVENOUS | Status: DC | PRN
  Filled 2016-08-16: qty 5
  Filled 2016-08-16: qty 10

## 2016-08-16 MED ORDER — TETANUS-DIPHTH-ACELL PERTUSSIS 5-2.5-18.5 LF-MCG/0.5 IM SUSP: 0.5000 mL | Freq: Once | INTRAMUSCULAR | Status: DC

## 2016-08-16 MED ORDER — PANTOPRAZOLE SODIUM 40 MG PO TBEC
40.0000 mg | DELAYED_RELEASE_TABLET | Freq: Every day | ORAL | Status: DC
  Administered 2016-08-17: 40 mg via ORAL
  Filled 2016-08-16: qty 1

## 2016-08-16 MED ORDER — BENZOCAINE-MENTHOL 20-0.5 % EX AERO
1.0000 "application " | INHALATION_SPRAY | CUTANEOUS | Status: DC | PRN
  Administered 2016-08-16: 1 via TOPICAL
  Filled 2016-08-16: qty 56

## 2016-08-16 MED ORDER — OXYTOCIN 40 UNITS IN LACTATED RINGERS INFUSION - SIMPLE MED
1.0000 m[IU]/min | INTRAVENOUS | Status: DC
  Administered 2016-08-16: 2 m[IU]/min via INTRAVENOUS

## 2016-08-16 MED ORDER — ACETAMINOPHEN 325 MG PO TABS
650.0000 mg | ORAL_TABLET | ORAL | Status: DC | PRN
  Administered 2016-08-17: 650 mg via ORAL
  Filled 2016-08-16: qty 2

## 2016-08-16 MED ORDER — ONDANSETRON HCL 4 MG/2ML IJ SOLN: 4.0000 mg | Freq: Four times a day (QID) | INTRAMUSCULAR | Status: DC | PRN

## 2016-08-16 MED ORDER — PRENATAL MULTIVITAMIN CH
1.0000 | ORAL_TABLET | Freq: Every day | ORAL | Status: DC
  Administered 2016-08-17: 1 via ORAL

## 2016-08-16 MED ORDER — ONDANSETRON HCL 4 MG/2ML IJ SOLN: 4.0000 mg | INTRAMUSCULAR | Status: DC | PRN

## 2016-08-16 MED ORDER — ACETAMINOPHEN 325 MG PO TABS: 650.0000 mg | ORAL_TABLET | ORAL | Status: DC | PRN

## 2016-08-16 MED ORDER — OXYCODONE-ACETAMINOPHEN 5-325 MG PO TABS: 1.0000 | ORAL_TABLET | ORAL | Status: DC | PRN

## 2016-08-16 MED ORDER — LIDOCAINE HCL (PF) 1 % IJ SOLN
INTRAMUSCULAR | Status: DC | PRN
  Administered 2016-08-16 (×2): 4 mL via EPIDURAL

## 2016-08-16 MED ORDER — LIDOCAINE HCL (PF) 1 % IJ SOLN
30.0000 mL | INTRAMUSCULAR | Status: DC | PRN
  Administered 2016-08-16: 30 mL via SUBCUTANEOUS
  Filled 2016-08-16: qty 30

## 2016-08-16 MED ORDER — IBUPROFEN 600 MG PO TABS
600.0000 mg | ORAL_TABLET | Freq: Four times a day (QID) | ORAL | Status: DC
  Administered 2016-08-16 – 2016-08-17 (×4): 600 mg via ORAL
  Filled 2016-08-16 (×5): qty 1

## 2016-08-16 MED ORDER — PHENYLEPHRINE 40 MCG/ML (10ML) SYRINGE FOR IV PUSH (FOR BLOOD PRESSURE SUPPORT)
80.0000 ug | PREFILLED_SYRINGE | INTRAVENOUS | Status: DC | PRN
  Filled 2016-08-16: qty 5

## 2016-08-16 MED ORDER — SENNOSIDES-DOCUSATE SODIUM 8.6-50 MG PO TABS
2.0000 | ORAL_TABLET | ORAL | Status: DC
  Administered 2016-08-17: 2 via ORAL
  Filled 2016-08-16: qty 2

## 2016-08-16 MED ORDER — OXYTOCIN BOLUS FROM INFUSION
500.0000 mL | Freq: Once | INTRAVENOUS | Status: AC
  Administered 2016-08-16: 500 mL/h via INTRAVENOUS

## 2016-08-16 MED ORDER — NALBUPHINE HCL 10 MG/ML IJ SOLN: 5.0000 mg | INTRAMUSCULAR | Status: DC | PRN

## 2016-08-16 MED ORDER — DIPHENHYDRAMINE HCL 25 MG PO CAPS: 25.0000 mg | ORAL_CAPSULE | Freq: Four times a day (QID) | ORAL | Status: DC | PRN

## 2016-08-16 MED ORDER — FENTANYL 2.5 MCG/ML BUPIVACAINE 1/10 % EPIDURAL INFUSION (WH - ANES)
14.0000 mL/h | INTRAMUSCULAR | Status: DC | PRN
  Administered 2016-08-16: 14 mL/h via EPIDURAL
  Filled 2016-08-16: qty 100

## 2016-08-16 NOTE — Progress Notes (Signed)
Patient ID: Colleen Nicholson, female   DOB: 11/29/1995, 21 y.o.   MRN: [id number]  S: Patient seen & examined for progress of labor. Patient getting comfortable with epidural, just placed. Denies HAs, changes in vision, RUQ/epigastric pain. States she had one other office visit with high blood pressure, but otherwise does not typically have HTN.    O:  Vitals:   08/16/16 0851 08/16/16 0859 08/16/16 0902 08/16/16 0907  BP: 131/78 140/61 140/74 136/73  Pulse: 88 100 90 89  Resp: 18     Temp:      TempSrc:      SpO2:      Weight:      Height:        Dilation: 6 Effacement (%): 90 Station: -1 Presentation: Vertex Exam by:: Juanito DoomG. Owensby RN   FHT: 125 bpm, mod var, +accels, no decels TOCO: q3-504min, difficult to assess after epidural due to [facility] needs adjusting after turned to side   A/P: RN Colleen Nicholson will recheck cervix when patient is more comfortable after the epidural placement, will assess progress of labor, currently NOT on pitocin, may consider adding TOLAC BPs recently normal after epidural, had a few severe range pressures taken during epidural, may have been from positional/epidural placement, currently asymptomatic and BPs are minimally elevated at baseline since arrival.  Continue expectant management Anticipate SVD

## 2016-08-16 NOTE — Anesthesia Procedure Notes (Signed)
Epidural Patient location during procedure: OB Start time: 08/16/2016 8:29 AM  Staffing Anesthesiologist: Mal AmabileFOSTER, Saralynn Langhorst Performed: anesthesiologist   Preanesthetic Checklist Completed: patient identified, site marked, surgical consent, pre-op evaluation, timeout performed, IV checked, risks and benefits discussed and monitors and equipment checked  Epidural Patient position: sitting Prep: site prepped and draped and DuraPrep Patient monitoring: continuous pulse ox and blood pressure Approach: midline Location: L3-L4 Injection technique: LOR air  Needle:  Needle type: Tuohy  Needle gauge: 17 G Needle length: 9 cm and 9 Needle insertion depth: 8 cm Catheter type: closed end flexible Catheter size: 19 Gauge Catheter at skin depth: 14 cm Test dose: negative and Other  Assessment Events: blood not aspirated, injection not painful, no injection resistance, negative IV test and no paresthesia  Additional Notes Patient identified. Risks and benefits discussed including failed block, incomplete  Pain control, post dural puncture headache, nerve damage, paralysis, blood pressure Changes, nausea, vomiting, reactions to medications-both toxic and allergic and post Partum back pain. All questions were answered. Patient expressed understanding and wished to proceed. Sterile technique was used throughout procedure. Epidural site was Dressed with sterile barrier dressing. No paresthesias, signs of intravascular injection Or signs of intrathecal spread were encountered.  Patient was more comfortable after the epidural was dosed. Please see RN's note for documentation of vital signs and FHR which are stable.

## 2016-08-16 NOTE — Anesthesia Preprocedure Evaluation (Signed)
Anesthesia Evaluation  Patient identified by MRN, date of birth, ID band Patient awake    Reviewed: Allergy & Precautions, NPO status , Patient's Chart, lab work & pertinent test results  Airway Mallampati: III       Dental no notable dental hx. (+) Teeth Intact   Pulmonary Current Smoker,    Pulmonary exam normal breath sounds clear to auscultation       Cardiovascular hypertension, Normal cardiovascular exam Rhythm:Regular Rate:Normal     Neuro/Psych negative neurological ROS  negative psych ROS   GI/Hepatic Neg liver ROS, GERD  ,  Endo/Other  Morbid obesity  Renal/GU negative Renal ROS  negative genitourinary   Musculoskeletal   Abdominal (+) + obese,   Peds  Hematology   Anesthesia Other Findings   Reproductive/Obstetrics (+) Pregnancy Previous C/Section                             Anesthesia Physical Anesthesia Plan  ASA: III  Anesthesia Plan: Epidural   Post-op Pain Management:    Induction:   Airway Management Planned: Natural Airway  Additional Equipment:   Intra-op Plan:   Post-operative Plan:   Informed Consent: I have reviewed the patients History and Physical, chart, labs and discussed the procedure including the risks, benefits and alternatives for the proposed anesthesia with the patient or authorized representative who has indicated his/her understanding and acceptance.     Plan Discussed with: Anesthesiologist  Anesthesia Plan Comments:         Anesthesia Quick Evaluation

## 2016-08-16 NOTE — H&P (Signed)
LABOR AND DELIVERY ADMISSION HISTORY AND PHYSICAL NOTE  Colleen Nicholson is a 21 y.o. female G37P0101 with IUP at [redacted]w[redacted]d by 12 week ultrasound presenting for spontaneous rupture of membranes and increased intensity and frequency of contractions.   Patient was evaluated overnight with some abdominal pain concerning for contractions as well as a single elevated blood pressure. Blood pressure has since been within normal limits, lab evaluation was not concerning for pre-eclampsia, and the patient is without any severe feature.    She reports positive fetal movement. She report leakage of fluid. No vaginal bleeding.  Prenatal History/Complications: History of c-section secondary to placental abruption, preterm labor, and breech presentation Tobacco use Obesity History of gestational hypertension  Past Medical History: Past Medical History:  Diagnosis Date  . Amenorrhea   . History of gestational hypertension 02/11/2016   Baseline P:C ratio 59  . History of low transverse cesarean section 02/21/2016  . History of placenta abruption 02/11/2016  . History of preterm delivery, currently pregnant in second trimester 02/21/2016  . Medical history non-contributory   . Obesity   . Pregnancy induced hypertension     Past Surgical History: Past Surgical History:  Procedure Laterality Date  . CESAREAN SECTION      Obstetrical History: OB History    Gravida Para Term Preterm AB Living   2 1   1   1    SAB TAB Ectopic Multiple Live Births           1      Social History: Social History   Social History  . Marital status: Married    Spouse name: N/A  . Number of children: N/A  . Years of education: N/A   Social History Main Topics  . Smoking status: Current Every Day Smoker    Packs/day: 1.00    Types: Cigarettes  . Smokeless tobacco: Never Used  . Alcohol use No  . Drug use: No  . Sexual activity: Yes   Other Topics Concern  . None   Social History Narrative  . None    Family  History: History reviewed. No pertinent family history.  Allergies: No Known Allergies  Facility-Administered Medications Prior to Admission  Medication Dose Route Frequency Provider Last Rate Last Dose  . hydroxyprogesterone caproate (MAKENA) 250 mg/mL injection 250 mg  250 mg Intramuscular Q7 days [place], CNM   250 mg at 07/27/16 1502   Prescriptions Prior to Admission  Medication Sig Dispense Refill Last Dose  . acetaminophen (TYLENOL) 325 MG tablet Take 650 mg by mouth every 6 (six) hours as needed for moderate pain or headache.    08/14/2016 at Unknown time  . pantoprazole (PROTONIX) 40 MG tablet Take 1 tablet (40 mg total) by mouth daily. 60 tablet 0 08/15/2016 at Unknown time  . Prenatal Multivit-Min-Fe-FA (PRENATAL VITAMINS) 0.8 MG tablet Take 1 tablet by mouth daily. 30 tablet 0 Past Week at Unknown time  . ranitidine (ZANTAC) 150 MG tablet Take 1 tablet (150 mg total) by mouth 2 (two) times daily. (Patient not taking: Reported on 08/12/2016) 60 tablet 4 More than a month at Unknown time     Review of Systems   All systems reviewed and negative except as stated in HPI  Blood pressure 141/90, pulse 110, temperature 98.3 F (36.8 C), temperature source Oral, resp. rate 18, height 5\' 5"  (1.651 m), weight 255 lb (115.7 kg), last menstrual period 11/02/2015, unknown if currently breastfeeding. General appearance: alert, cooperative and no distress Lungs: clear to  auscultation bilaterally Heart: regular rate and rhythm Abdomen: soft, non-tender; bowel sounds normal Extremities: No calf swelling or tenderness Presentation: cephalic Fetal monitoring: 130s, +accels, -decels, mod variability Uterine activity: on the monitor has an irregular pattern; patient reporting regular contractions every 3-4 minutes or so  Dilation: 6 Effacement (%): 90 Station: -1 Exam by:: Juanito Doom RN   Prenatal labs: ABO, Rh: O/POS/-- (07/27 1646) Antibody: NEG (07/27 1646) Rubella:  Immune RPR: NON REAC (11/15 0949)  HBsAg: NEGATIVE (07/27 1646)  HIV: NONREACTIVE (11/15 0949)  GBS:   Negative 1 hr Glucola: abnormal (97), but passed 3-hr Genetic screening:  Quad screen negative Anatomy US: normal, growth at 37%  Prenatal Transfer Tool  Maternal Diabetes: No Genetic Screening: Normal Maternal Ultrasounds/Referrals: Normal Fetal Ultrasounds or other Referrals:  None Maternal Substance Abuse:  Yes:  Type: Smoker Significant Maternal Medications:  None Significant Maternal Lab Results: Lab values include: Group B Strep negative  Results for orders placed or performed during the hospital encounter of 08/16/16 (from the past 24 hour(s))  Fern Test   Collection Time: 08/16/16  7:02 AM  Result Value Ref Range   POCT Fern Test Positive = ruptured amniotic membanes   Results for orders placed or performed during the hospital encounter of 08/15/16 (from the past 24 hour(s))  Urinalysis, Routine w reflex microscopic   Collection Time: 08/15/16  8:09 PM  Result Value Ref Range   Color, Urine YELLOW YELLOW   APPearance CLOUDY (A) CLEAR   Specific Gravity, Urine 1.020 1.005 - 1.030   pH 6.0 5.0 - 8.0   Glucose, UA NEGATIVE NEGATIVE mg/dL   Hgb urine dipstick NEGATIVE NEGATIVE   Bilirubin Urine NEGATIVE NEGATIVE   Ketones, ur NEGATIVE NEGATIVE mg/dL   Protein, ur 30 (A) NEGATIVE mg/dL   Nitrite NEGATIVE NEGATIVE   Leukocytes, UA MODERATE (A) NEGATIVE   RBC / HPF 0-5 0 - 5 RBC/hpf   WBC, UA TOO NUMEROUS TO COUNT 0 - 5 WBC/hpf   Bacteria, UA NONE SEEN NONE SEEN   Squamous Epithelial / LPF TOO NUMEROUS TO COUNT (A) NONE SEEN   Mucous PRESENT   Protein / creatinine ratio, urine   Collection Time: 08/15/16  8:09 PM  Result Value Ref Range   Creatinine, Urine 185.00 mg/dL   Total Protein, Urine 26 mg/dL   Protein Creatinine Ratio 0.14 0.00 - 0.15 mg/mg[Cre]  CBC   Collection Time: 08/15/16  8:50 PM  Result Value Ref Range   WBC 14.0 (H) 4.0 - 10.5 K/uL   RBC 4.21  3.87 - 5.11 MIL/uL   Hemoglobin 12.9 12.0 - 15.0 g/dL   HCT 16.1 09.6 - 04.5 %   MCV 89.1 78.0 - 100.0 fL   MCH 30.6 26.0 - 34.0 pg   MCHC 34.4 30.0 - 36.0 g/dL   RDW 40.9 81.1 - 91.4 %   Platelets 363 150 - 400 K/uL  Comprehensive metabolic panel   Collection Time: 08/15/16  8:50 PM  Result Value Ref Range   Sodium 136 135 - 145 mmol/L   Potassium 3.3 (L) 3.5 - 5.1 mmol/L   Chloride 107 101 - 111 mmol/L   CO2 20 (L) 22 - 32 mmol/L   Glucose, Bld 101 (H) 65 - 99 mg/dL   BUN 6 6 - 20 mg/dL   Creatinine, Ser 7.82 0.44 - 1.00 mg/dL   Calcium 8.1 (L) 8.9 - 10.3 mg/dL   Total Protein 6.5 6.5 - 8.1 g/dL   Albumin 2.9 (L) 3.5 - 5.0  g/dL   AST 18 15 - 41 U/L   ALT 12 (L) 14 - 54 U/L   Alkaline Phosphatase 203 (H) 38 - 126 U/L   Total Bilirubin 0.3 0.3 - 1.2 mg/dL   GFR calc non Af Amer >60 >60 mL/min   GFR calc Af Amer >60 >60 mL/min   Anion gap 9 5 - 15    Patient Active Problem List   Diagnosis Date Noted  . Normal labor 08/16/2016  . Obesity in pregnancy 07/13/2016  . Short interval between pregnancies affecting pregnancy in third trimester, antepartum 07/13/2016  . History of low transverse cesarean section 02/21/2016  . History of preterm delivery, currently pregnant in third trimester 02/21/2016  . Supervision of other high risk pregnancy, antepartum 02/21/2016  . History of gestational hypertension 02/11/2016  . History of placenta abruption 02/11/2016  . BMI 38.0-38.9,adult 07/21/2015  . Smoker 07/21/2015  . Family history of neurofibromatosis, type 1 (von Recklinghausen's disease) 03/16/2015    Assessment: Colleen Nicholson is a 21 y.o. G2P0101 at 1864w6d here for spontaneous onset of labor, which will be a VBAC for her.   #labor: VBAC consent signed. Admitted with plan for expectant management.  #Pain: Desires an epidural when appropriate.  #FWB: Cat I #ID:  GBS neg- no abx warranted #MOF: bottle #MOC:BTL (consent signed) #Circ:  Yes, unsure if inpatient or  outpatient at this time  Continue to monitor pressure closely during labor as she appears to have borderline gestational hypertension, with one elevated BP in the office and one when initially arriving to triage, that resolved quickly.   Lise AuerMegan C Campbell, MD PGY-2 08/16/2016, 7:46 AM    OB FELLOW HISTORY AND PHYSICAL ATTESTATION  I have seen and examined this patient; I agree with above documentation in the resident's note.    Jen MowElizabeth Abeeha Twist, DO [place] Fellow 08/16/2016

## 2016-08-16 NOTE — MAU Note (Signed)
Pt presents complaining of SROM at 0620 and contractions that got worse after her water broke. Clear fluid. Denies bleeding. Reports good fetal movement.

## 2016-08-17 ENCOUNTER — Encounter (HOSPITAL_COMMUNITY)
Admission: AD | Disposition: A | Discharge status: Home / Self Care | Payer: Self-pay | Source: Ambulatory Visit | Attending: Family Medicine

## 2016-08-17 ENCOUNTER — Encounter: Payer: Medicaid Other | Admitting: Obstetrics & Gynecology

## 2016-08-17 SURGERY — LIGATION, FALLOPIAN TUBE, POSTPARTUM
Anesthesia: Choice | Laterality: Bilateral

## 2016-08-17 MED ORDER — IBUPROFEN 600 MG PO TABS: 600.0000 mg | ORAL_TABLET | Freq: Four times a day (QID) | ORAL | 0 refills | Status: DC

## 2016-08-17 MED ORDER — HYDROCHLOROTHIAZIDE 12.5 MG PO TABS: 12.5000 mg | ORAL_TABLET | Freq: Every day | ORAL | 0 refills | Status: DC

## 2016-08-17 MED ORDER — SENNOSIDES-DOCUSATE SODIUM 8.6-50 MG PO TABS: 1.0000 | ORAL_TABLET | Freq: Every evening | ORAL | 0 refills | Status: DC | PRN

## 2016-08-17 NOTE — Anesthesia Postprocedure Evaluation (Signed)
Anesthesia Post Note  Patient: Colleen Nicholson  Procedure(s) Performed: * No procedures listed *  Patient location during evaluation: Mother Baby Anesthesia Type: Epidural Level of consciousness: awake and alert and oriented Pain management: pain level controlled Vital Signs Assessment: post-procedure vital signs reviewed and stable Respiratory status: spontaneous breathing and nonlabored ventilation Cardiovascular status: stable Postop Assessment: no headache, no backache, epidural receding, patient able to bend at knees, no signs of nausea or vomiting and adequate PO intake Anesthetic complications: no        Last Vitals:  Vitals:   08/16/16 2330 08/17/16 0500  BP: 125/71 123/69  Pulse: (!) 107 98  Resp: 18 18  Temp: 37.1 C 37 C    Last Pain:  Vitals:   08/17/16 0610  TempSrc:   PainSc: Asleep   Pain Goal:                 [name],[name]

## 2016-08-17 NOTE — Progress Notes (Signed)
POSTPARTUM PROGRESS NOTE  Post Partum Day 1  Subjective:  Colleen Nicholson is a 21 y.o. [id number] 3070w6d s/p VBAC.  No acute events overnight.  Pt denies problems with ambulating, voiding or po intake.  She denies nausea or vomiting.  Pain is well controlled.  She has had flatus. She has not had bowel movement.  Lochia Moderate.   Objective: Blood pressure 123/69, pulse 98, temperature 98.6 F (37 C), temperature source Oral, resp. rate 18, height 5\' 5"  (1.651 m), weight 115.7 kg (255 lb), last menstrual period 11/02/2015, SpO2 99 %, unknown if currently breastfeeding.  Physical Exam:  General: alert, cooperative and no distress Lochia:normal flow Chest: CTAB Heart: RRR no m/r/g Abdomen: +BS, soft, nontender,  Uterine Fundus: firm, below level of umbilicus DVT Evaluation: No calf swelling or tenderness Extremities: 1+ pitting lower extremity edema bilaterally   Recent Labs  08/15/16 2050 08/16/16 0730  HGB 12.9 13.6  HCT 37.5 39.8    Assessment/Plan:  ASSESSMENT: Colleen Nicholson is a 21 y.o. [id number] 7370w6d s/p VBAC doing well today with well controled pain and moderate lochia.  Discharge home and Contraception would like to cancel BTL in favor of nexplanon so that she can be discharged home as soon as possible.  Circumcision will be done outside the hospital as well.   LOS: 1 day   Satira AnisSung W Park, Medical Student PGY-2 08/17/2016, 7:50 AM

## 2016-08-17 NOTE — Progress Notes (Signed)
Patient ID: Ladell PierJoanie N Thaw, female   DOB: 05/23/1996, 21 y.o.   MRN: [id number]  POSTPARTUM PROGRESS NOTE  Post Partum Day #1 Subjective:  Ladell PierJoanie N Mauzy is a 21 y.o. [id number] 8881w6d s/p successful VBAC.  No acute events overnight.  Pt denies problems with ambulating, voiding or po intake.  She denies nausea or vomiting.  Pain is well controlled.  She has had flatus. She has not had bowel movement.  Lochia Moderate without clots and changing pads every 4 hours. Denies HAs, changes in vision, RUQ/epigastric pain.   Objective:  08/17/16 0500 08/17/16 0754  BP: 123/69 125/67  Pulse: 98 94  Resp: 18 18  Temp: 98.6 F (37 C) 98.3 F (36.8 C)  TempSrc: Oral Oral  SpO2:    Weight:    Height:      Physical Exam:  General: alert, cooperative and no distress Lochia:normal flow Chest: CTAB Heart: RRR no m/r/g Abdomen: +BS, soft, nontender,  Uterine Fundus: firm, below umbilicus DVT Evaluation: No calf swelling or tenderness Extremities: Trace pitting +1 edema bilaterally   Recent Labs  08/15/16 2050 08/16/16 0730  HGB 12.9 13.6  HCT 37.5 39.8    Assessment/Plan:  ASSESSMENT: Ladell PierJoanie N Ardolino is a 21 y.o. [id number] 6581w6d s/p VBAC  Elevated BPs yesterday, rechecked BP cuff size and needs to be using adult large cuff, not the extra large  Bottle feeding Plans for nexplanon Remove epidural catheter, can shower To follow up in office PPD#3 and 7 for BP check  Will reevaluate this afternoon for possible d/c home today   LOS: 1 day   Jen MowElizabeth Nataliyah Packham, DO OB Fellow [facility]  08/17/2016, 10:09 AM

## 2016-08-17 NOTE — Discharge Summary (Signed)
OB Discharge Summary     Patient Name: Colleen Nicholson DOB: 01/10/1996 MRN: [id number]  Date of admission: 08/16/2016 Delivering MD: Jen MowMUMAW, Maebelle [facility]   Date of discharge: 08/17/2016  Admitting diagnosis: 3822w6d, [place]  desires sterilization Intrauterine pregnancy: 7522w6d     Secondary diagnosis:  Active Problems:   Normal labor  Additional problems: elevated blood pressure without end organ signs/symptoms     Discharge diagnosis: Term Pregnancy Delivered and VBAC                                                                                                Post partum procedures:none  Augmentation: none  Complications: None  Hospital course:  Onset of Labor With Vaginal Delivery     21 y.o. yo [id number] at 3022w6d was admitted in Active Labor on 08/16/2016. Patient had an uncomplicated labor course as follows:  Membrane Rupture Time/Date: 6:20 AM ,08/16/2016   Intrapartum Procedures: Episiotomy: None [1]                                         Lacerations:  1st degree [2]  Patient had a delivery of a Viable infant. 08/16/2016  Information for the patient's newborn:  Colleen Nicholson, Boy Colleen Nicholson [[id number]][[id number]]  Delivery Method: VBAC, Spontaneous (Filed from Delivery Summary)    Pateint had an uncomplicated postpartum course.  She is ambulating, tolerating a regular diet, passing flatus, and urinating well. Patient is discharged home in stable condition on 08/17/16.   Physical exam  Vitals:   08/17/16 0754 08/17/16 1113 08/17/16 1402 08/17/16 1518  BP: 125/67 123/70 (!) 141/71 139/75  Pulse: 94 90 92 98  Resp: 18     Temp: 98.3 F (36.8 C)     TempSrc: Oral     SpO2:      Weight:      Height:       General: alert and cooperative Lochia: appropriate Uterine Fundus: firm Incision: N/A DVT Evaluation: No evidence of DVT seen on physical exam. Labs: Lab Results  Component Value Date   WBC 15.8 (H) 08/16/2016   HGB 13.6 08/16/2016   HCT 39.8 08/16/2016   MCV 89.2  08/16/2016   PLT 378 08/16/2016   CMP Latest Ref Rng & Units 08/16/2016  Glucose 65 - 99 mg/dL 95  BUN 6 - 20 mg/dL 5(L)  Creatinine 1.300.44 - 1.00 mg/dL 8.650.50  Sodium 784135 - 696145 mmol/L 136  Potassium 3.5 - 5.1 mmol/L 4.4  Chloride 101 - 111 mmol/L 108  CO2 22 - 32 mmol/L 19(L)  Calcium 8.9 - 10.3 mg/dL 2.9(B8.6(L)  Total Protein 6.5 - 8.1 g/dL 6.1(L)  Total Bilirubin 0.3 - 1.2 mg/dL 1.1  Alkaline Phos 38 - 126 U/L 214(H)  AST 15 - 41 U/L 29  ALT 14 - 54 U/L 13(L)    Discharge instruction: per After Visit Summary and "Baby and Me Booklet".  After visit meds:  Allergies as of 08/17/2016   No Known Allergies     Medication  List    STOP taking these medications   acetaminophen 325 MG tablet Commonly known as:  TYLENOL   Prenatal Vitamins 0.8 MG tablet     TAKE these medications   hydrochlorothiazide 12.5 MG tablet Commonly known as:  HYDRODIURIL Take 1 tablet (12.5 mg total) by mouth daily.   ibuprofen 600 MG tablet Commonly known as:  ADVIL,MOTRIN Take 1 tablet (600 mg total) by mouth every 6 (six) hours. Start taking on:  08/18/2016   pantoprazole 40 MG tablet Commonly known as:  PROTONIX Take 1 tablet (40 mg total) by mouth daily.   senna-docusate 8.6-50 MG tablet Commonly known as:  Senokot-S Take 1 tablet by mouth at bedtime as needed for mild constipation.       Diet: routine diet  Activity: Advance as tolerated. Pelvic rest for 6 weeks.   Outpatient follow up: Patient to call office on 08/18/16 to schedule BP checks for 08/19/16 and 08/22/16  Follow up Appt: Future Appointments Date Time Provider Department Center  09/29/2016 8:15 AM Tereso Newcomer, MD [facility]   Follow up Visit:No Follow-up on file.   Postpartum contraception: Nexplanon  Newborn Data: Live born female  Birth Weight: 7 lb 8.1 oz (3405 g) APGAR: 9, 9  Baby Feeding: Bottle Disposition:home with mother   08/17/2016 Renne Musca, MD   OB FELLOW DISCHARGE ATTESTATION  I  have seen and examined this patient and agree with above documentation in the resident's note.  Patient had persistent mild hypertension. She denies any sign/symptoms of preeclampsia. Recommended for patient to call office and set up a BP check on Friday and next Tuesday. Note was also sent to the office to call the patient for available time to check up on BPs. Sent home on antiHTN.   Jen Mow, DO OB Fellow

## 2016-08-17 NOTE — Discharge Instructions (Signed)
We have started you on a new blood pressure medication called hydrochlorothiazide.  Please take this one time daily.  We also have scheduled appointments for you to get your blood [facility]     Home Care Instructions for Mom Introduction  ACTIVITY  Gradually return to your regular activities.  Let yourself rest. Nap while your baby sleeps.  Avoid lifting anything that is heavier than 10 lb (4.5 kg) until your health care provider says it is okay.  Avoid activities that take a lot of effort and energy (are strenuous) until approved by your health care provider. Walking at a slow-to-moderate pace is usually safe.  If you had a cesarean delivery:  Do not vacuum, climb stairs, or drive a car for 4-6 weeks.  Have someone help you at home until you feel like you can do your usual activities yourself.  Do exercises as told by your health care provider, if this applies. VAGINAL BLEEDING You may continue to bleed for 4-6 weeks after delivery. Over time, the amount of blood usually decreases and the color of the blood usually gets lighter. However, the flow of bright red blood may increase if you have been too active. If you need to use more than one pad in an hour because your pad gets soaked, or if you pass a large clot:  Lie down.  Raise your feet.  Place a cold compress on your lower abdomen.  Rest.  Call your health care provider. If you are breastfeeding, your period should return anytime between 8 weeks after delivery and the time that you stop breastfeeding. If you are not breastfeeding, your period should return 6-8 weeks after delivery. PERINEAL CARE The perineal area, or perineum, is the part of your body between your thighs. After delivery, this area needs special care. Follow these instructions as told by your health care provider.  Take warm tub baths for 15-20 minutes.  Use medicated pads and pain-relieving sprays and  creams as told.  Do not use tampons or douches until vaginal bleeding has stopped.  Each time you go to the bathroom:  Use a peri bottle.  Change your pad.  Use towelettes in place of toilet paper until your stitches have healed.  Do Kegel exercises every day. Kegel exercises help to maintain the muscles that support the vagina, bladder, and bowels. You can do these exercises while you are standing, sitting, or lying down. To do Kegel exercises:  Tighten the muscles of your abdomen and the muscles that surround your birth canal.  Hold for a few seconds.  Relax.  Repeat until you have done this 5 times in a row.  To prevent hemorrhoids from developing or getting worse:  Drink enough fluid to keep your urine clear or pale yellow.  Avoid straining when having a bowel movement.  Take over-the-counter medicines and stool softeners as told by your health care provider. BREAST CARE  Wear a tight-fitting bra.  Avoid taking over-the-counter pain medicine for breast discomfort.  Apply ice to the breasts to help with discomfort as needed:  Put ice in a plastic bag.  Place a towel between your skin and the bag.  Leave the ice on for 20 minutes or as told by your health care provider. NUTRITION  Eat a well-balanced diet.  Do not try to lose weight quickly by cutting back on calories.  Take your prenatal vitamins until your postpartum checkup or until your health care  provider tells you to stop. POSTPARTUM DEPRESSION You may find yourself crying for no apparent reason and unable to cope with all of the changes that come with having a newborn. This mood is called postpartum depression. Postpartum depression happens because your hormone levels change after delivery. If you have postpartum depression, get support from your partner, friends, and family. If the depression does not go away on its own after several weeks, contact your health care provider. BREAST SELF-EXAM Do a breast  self-exam each month, at the same time of the month. If you are breastfeeding, check your breasts just after a feeding, when your breasts are less full. If you are breastfeeding and your period has started, check your breasts on day 5, 6, or 7 of your period. Report any lumps, bumps, or discharge to your health care provider. Know that breasts are normally lumpy if you are breastfeeding. This is temporary, and it is not a health risk. INTIMACY AND SEXUALITY Avoid sexual activity for at least 3-4 weeks after delivery or until the brownish-red vaginal flow is completely gone. If you want to avoid pregnancy, use some form of birth control. You can get pregnant after delivery, even if you have not had your period. SEEK MEDICAL CARE IF:  You feel unable to cope with the changes that a child brings to your life, and these feelings do not go away after several weeks.  You notice a lump, a bump, or discharge on your breast. SEEK IMMEDIATE MEDICAL CARE IF:  Blood soaks your pad in 1 hour or less.  You have:  Severe pain or cramping in your lower abdomen.  A bad-smelling vaginal discharge.  A fever that is not controlled by medicine.  A fever, and an area of your breast is red and sore.  Pain or redness in your calf.  Sudden, severe chest pain.  Shortness of breath.  Painful or bloody urination.  Problems with your vision.  You vomit for 12 hours or longer.  You develop a severe headache.  You have serious thoughts about hurting yourself, your child, or anyone else. This information is not intended to replace advice given to you by your health care provider. Make sure you discuss any questions you have with your health care provider. Document Released: 07/01/2000 Document Revised: 12/10/2015 Document Reviewed: 01/05/2015  2017 Elsevier

## 2016-08-17 NOTE — Progress Notes (Signed)
UR chart review completed.  

## 2016-08-17 NOTE — Progress Notes (Signed)
Patient states that she wants to be discharged as soon as possible and does not want to have tubal if it will delay discharge.

## 2016-08-19 ENCOUNTER — Ambulatory Visit: Payer: Medicaid Other | Admitting: *Deleted

## 2016-08-19 VITALS — BP 136/74 | HR 111

## 2016-08-19 DIAGNOSIS — Z0130 Encounter for examination of blood pressure without abnormal findings: Secondary | ICD-10-CM

## 2016-08-19 NOTE — Progress Notes (Signed)
Pt here today for postpartum BP check, BP=136/74.  Pt has started the HCTZ 12.5mg .  Instructed pt to continue medication until finished and reviewed preeclampsia precautions.  Will follow-up at her postpartum check.

## 2016-08-24 ENCOUNTER — Encounter: Payer: Medicaid Other | Admitting: Family Medicine

## 2016-09-05 ENCOUNTER — Encounter: Payer: Self-pay | Admitting: *Deleted

## 2016-09-29 ENCOUNTER — Ambulatory Visit (INDEPENDENT_AMBULATORY_CARE_PROVIDER_SITE_OTHER): Payer: Medicaid Other | Admitting: Obstetrics & Gynecology

## 2016-09-29 ENCOUNTER — Ambulatory Visit: Payer: Medicaid Other | Admitting: Obstetrics & Gynecology

## 2016-09-29 ENCOUNTER — Encounter: Payer: Self-pay | Admitting: Obstetrics & Gynecology

## 2016-09-29 ENCOUNTER — Other Ambulatory Visit (HOSPITAL_COMMUNITY)
Admission: RE | Admit: 2016-09-29 | Discharge: 2016-09-29 | Disposition: A | Discharge status: Home / Self Care | Payer: Medicaid Other | Source: Ambulatory Visit | Attending: Obstetrics & Gynecology | Admitting: Obstetrics & Gynecology

## 2016-09-29 DIAGNOSIS — Z30011 Encounter for initial prescription of contraceptive pills: Secondary | ICD-10-CM

## 2016-09-29 DIAGNOSIS — Z01411 Encounter for gynecological examination (general) (routine) with abnormal findings: Secondary | ICD-10-CM | POA: Diagnosis present

## 2016-09-29 DIAGNOSIS — Z124 Encounter for screening for malignant neoplasm of cervix: Secondary | ICD-10-CM

## 2016-09-29 MED ORDER — NORETHIN ACE-ETH ESTRAD-FE 1-20 MG-MCG PO TABS: 1.0000 | ORAL_TABLET | Freq: Every day | ORAL | 11 refills | Status: DC

## 2016-09-29 NOTE — Progress Notes (Signed)
    Post Partum Exam  Colleen Nicholson is a 21 y.o. [id number] female who presents for a postpartum visit. She is 6 weeks postpartum following a spontaneous vaginal delivery. I have fully reviewed the prenatal and intrapartum course. The delivery was at 4027w6d gestational weeks.  Anesthesia: epidural. Postpartum course has been unremarkable. Baby's course has been unremarkable. Baby is feeding by bottle - Similac Advance. Bleeding started spotting yesterday. Bowel function is normal. Bladder function is normal. Patient is sexually active. Contraception method: condoms currently but desires OCP (estrogen/progesterone). Postpartum depression screening:negative.  The following portions of the patient's history were reviewed and updated as appropriate: allergies, current medications, past family history, past medical history, past social history, past surgical history and problem list.  Review of Systems Pertinent items noted in HPI and remainder of comprehensive ROS otherwise negative.    Objective:  Blood pressure 134/83, pulse (!) 111, weight 238 lb (108 kg), last menstrual period 09/28/2016, unknown if currently breastfeeding.  General:  alert and no distress   Breasts:  inspection negative, no nipple discharge or bleeding, no masses or nodularity palpable  Lungs: clear to auscultation bilaterally  Heart:  regular rate and rhythm  Abdomen: soft, non-tender; bowel sounds normal; no masses,  no organomegaly   Vulva:  normal  Vagina: normal vagina, no discharge, exudate, lesion, or erythema  Cervix:  multiparous appearance, no bleeding following Pap and no lesions  Corpus: normal size, contour, position, consistency, mobility, non-tender  Adnexa:  normal adnexa and no mass, fullness, tenderness  Rectal Exam: Not performed.        Assessment:   Normal postpartum exam. Pap smear done at today's visit.   Plan:   1. Contraception: OCP (estrogen/progesterone); Loestrin prescribed given smoking  history and borderline BP today. Recheck BP in 1 month. Advised to use backup contraception for at least one week, condoms recommended for STI prevention. 2. Will follow up pap results and manage accordingly. 3. Follow up in: 1 months for OCP and BP check or as needed.   Jaynie CollinsUGONNA  ANYANWU, MD, FACOG Attending Obstetrician & Gynecologist, [facility], [facility]

## 2016-09-29 NOTE — Patient Instructions (Signed)
Return to clinic for any scheduled appointments or for any gynecologic concerns as needed.   

## 2016-10-05 LAB — CYTOLOGY - PAP: Diagnosis: NEGATIVE

## 2016-10-26 IMAGING — US US MFM OB FOLLOW-UP
1 series · 14 of 28 positions shown · non-contrast
Comparison: none

[Series 1: us mfm ob follow-up · 46 acquisitions, 14 frames shown]
[im 2/46]
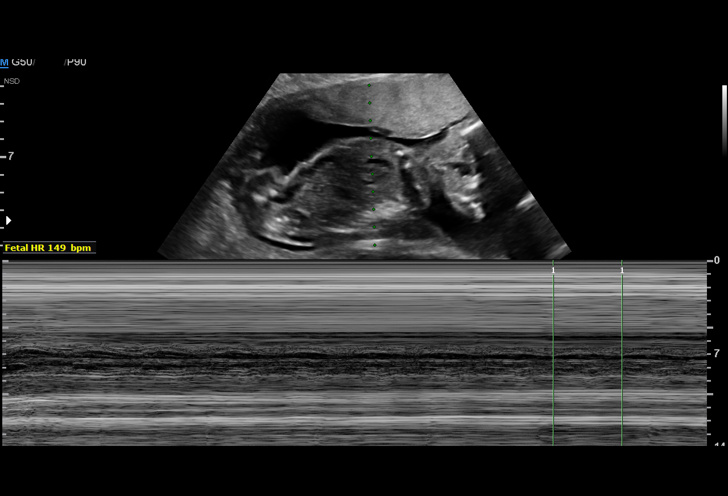
[im 6/46]
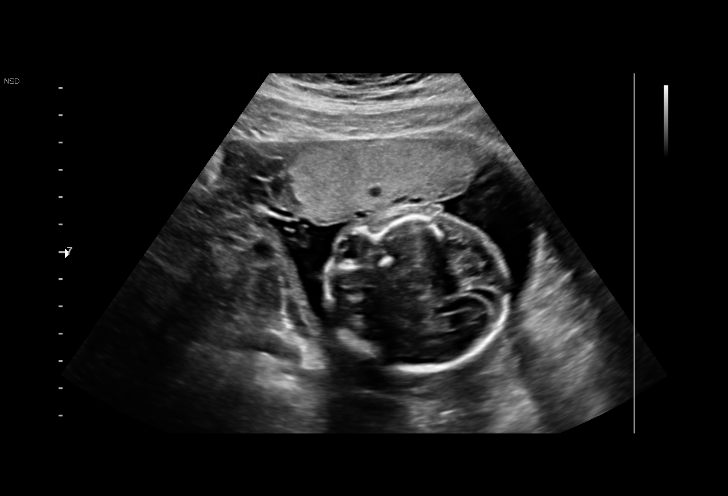
[im 9/46]
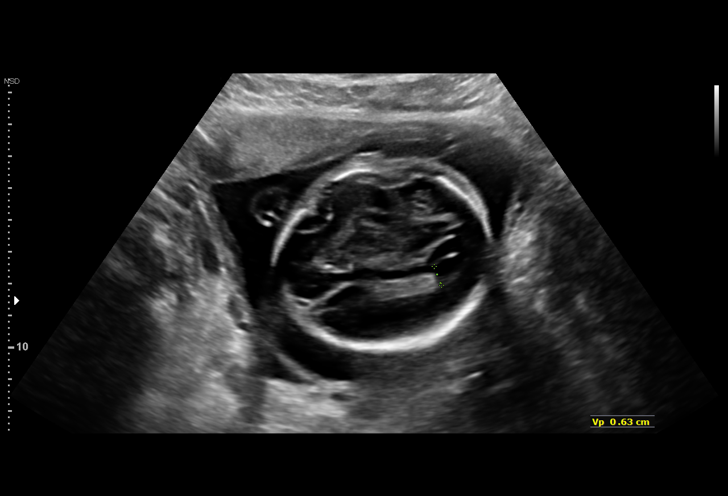
[im 12/46]
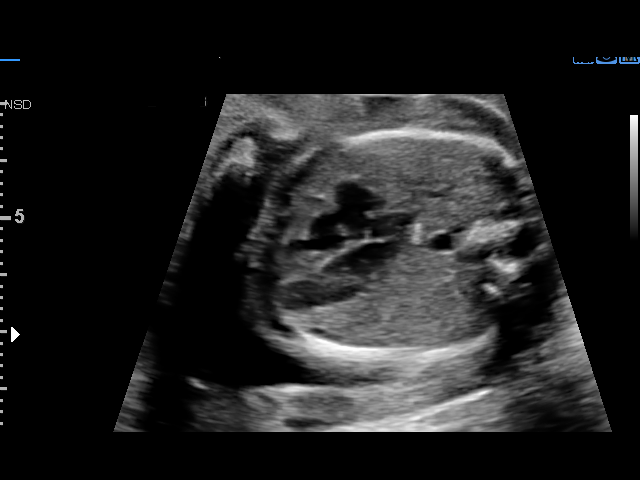
[im 16/46]
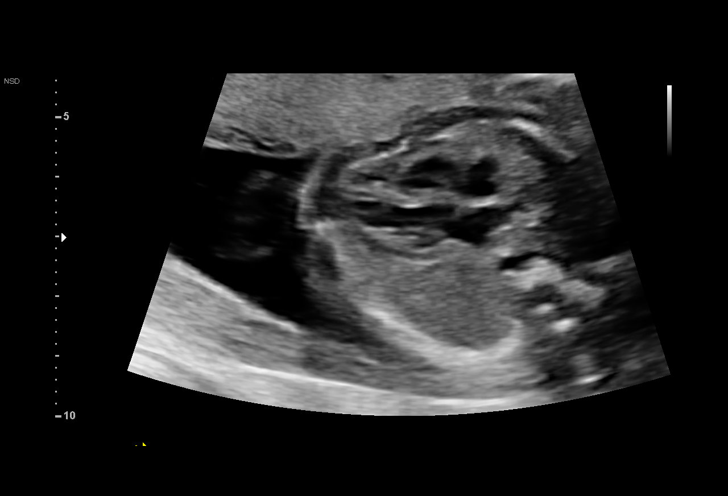
[im 19/46]
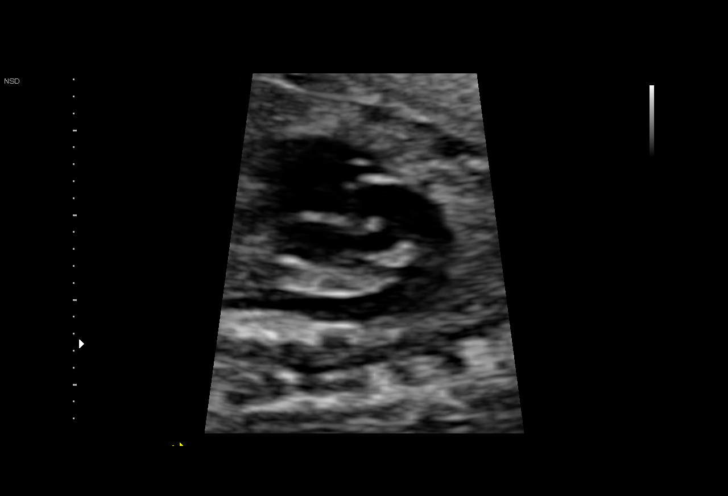
[im 22/46]
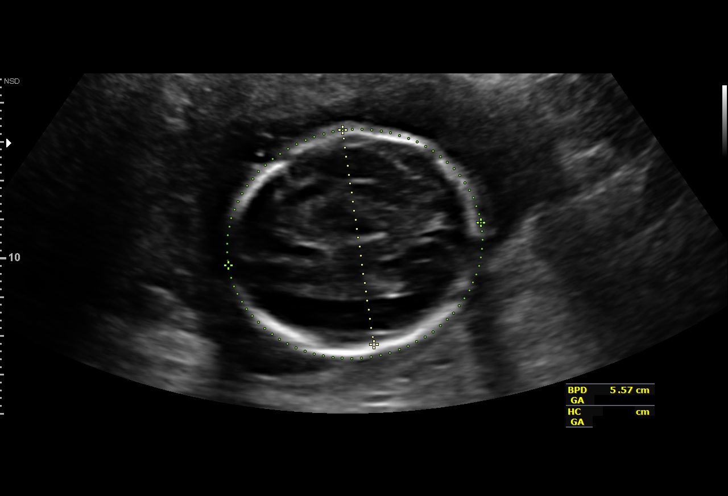
[im 26/46]
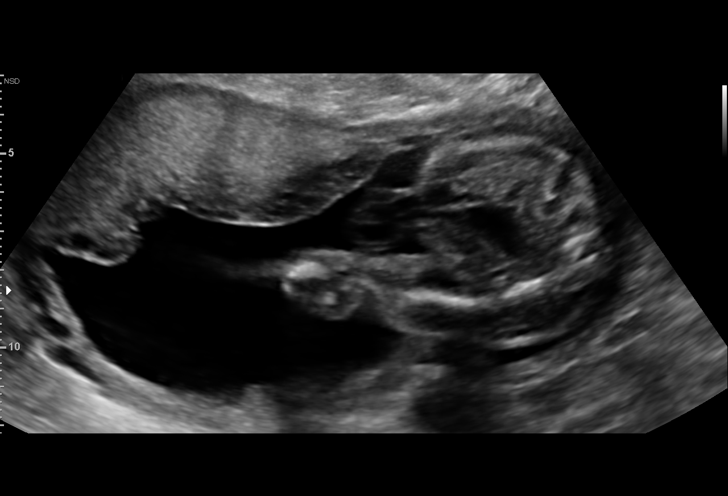
[im 29/46]
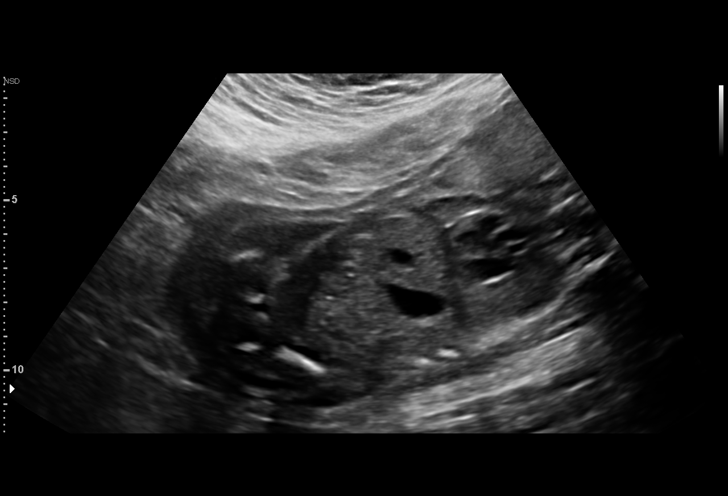
[im 32/46]
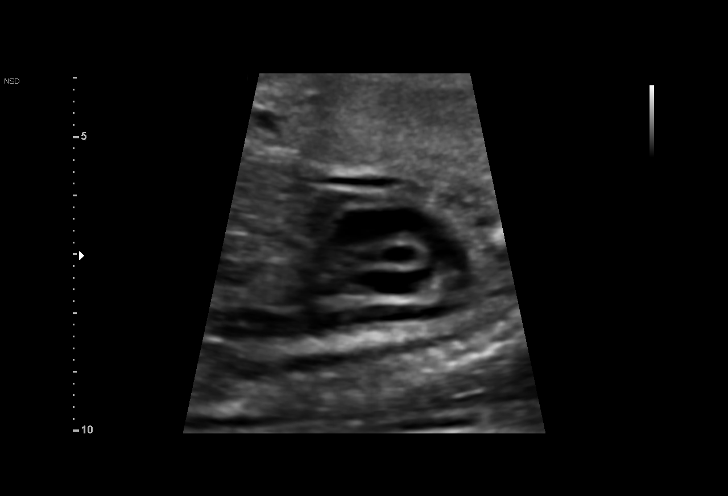
[im 36/46]
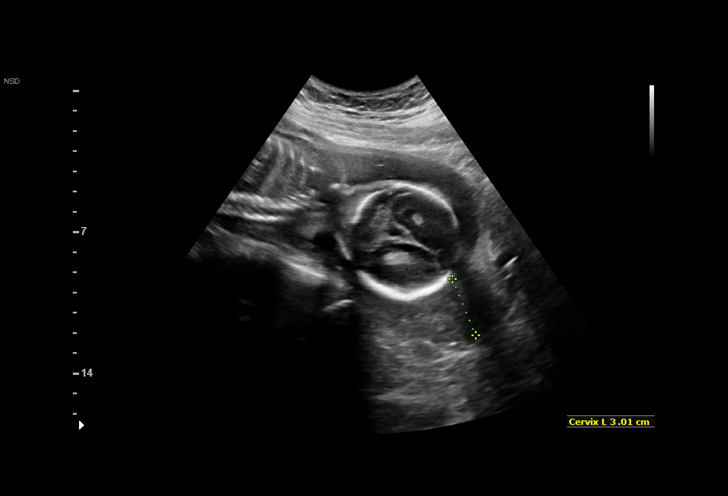
[im 39/46]
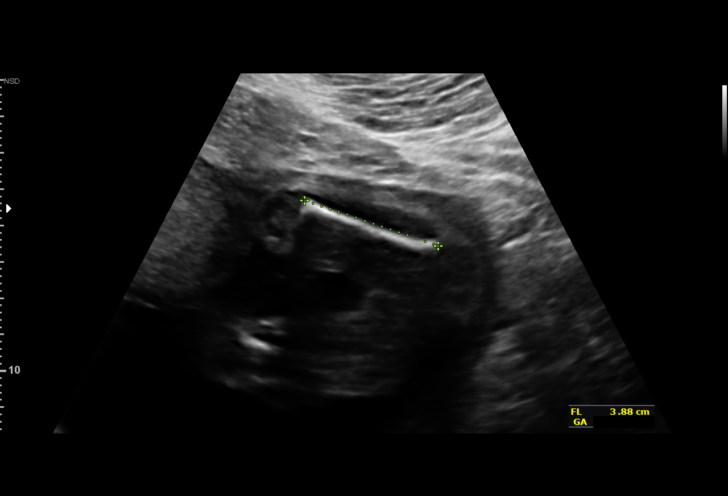
[im 42/46]
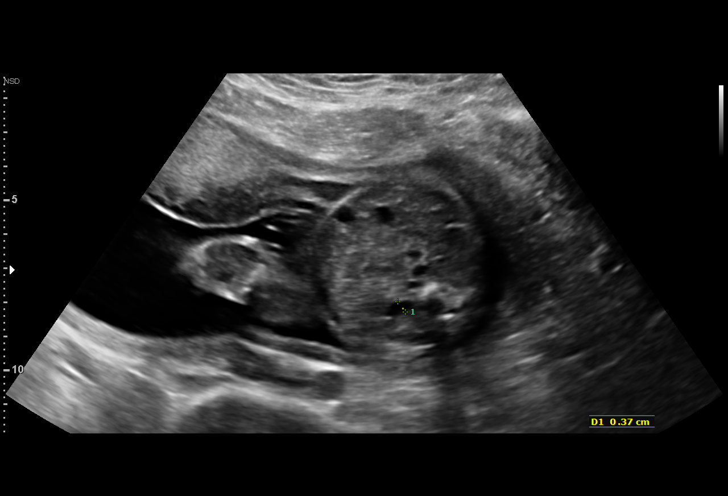
[im 46/46]
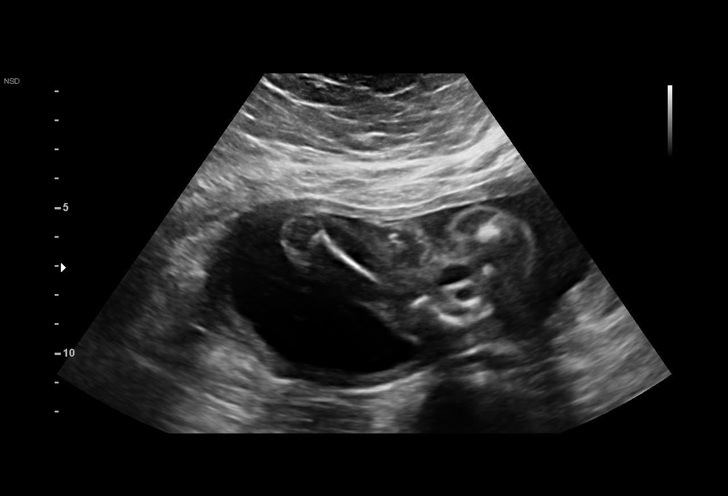

[14 of 28 positions shown; findings below may reference images not displayed]

[REDACTED]

Indications

23 weeks gestation of pregnancy
Poor obstetric history: Previous
preeclampsia / eclampsia/gestational HTN
Obesity complicating pregnancy, second
trimester
History of cesarean delivery, currently
pregnant
Poor obstetric history: Previous preterm
delivery, antepartum (28w 1d)
Antenatal follow-up for nonvisualized fetal
anatomy
Family history of genetic disorder (Partner
with Neurofibromatosis Type 1)
Tobacco use complicating pregnancy,
second trimester
Short interval between pregancies, 2nd
trimester (Delivered 07-25-15)
OB History

Blood Type:            Height:  5'5"   Weight (lb):  238      BMI:
Gravidity:    2         Term:   0        Prem:   1        SAB:   0
TOP:          0       Ectopic:  0        Living: 1
Fetal Evaluation
Num Of Fetuses:     1
Fetal Heart         149
Rate(bpm):
Cardiac Activity:   Observed
Presentation:       Cephalic
Placenta:           Anterior, above cervical os
P. Cord Insertion:  Previously seen as normal

Amniotic Fluid
AFI FV:      Subjectively within normal limits

Largest Pocket(cm)
5.7
Biometry

BPD:      55.8  mm     G. Age:  23w 0d         46  %    CI:        80.97   %   70 - 86
FL/HC:      19.5   %   19.2 -
HC:      195.8  mm     G. Age:  21w 5d          5  %    HC/AC:      1.11       1.05 -
AC:      176.4  mm     G. Age:  22w 4d         28  %    FL/BPD:     68.5   %   71 - 87
FL:       38.2  mm     G. Age:  22w 2d         18  %    FL/AC:      21.7   %   20 - 24

Est. FW:     498  gm      1 lb 2 oz     39  %
Gestational Age

LMP:           25w 2d       Date:   11/02/15                 EDD:   08/08/16
U/S Today:     22w 3d                                        EDD:   08/28/16
Best:          23w 0d    Det. By:   Early Ultrasound         EDD:   08/24/16
(02/11/16)
Anatomy

Cranium:               Appears normal         Aortic Arch:            Previously seen
Cavum:                 Previously seen        Ductal Arch:            Appears normal
Ventricles:            Appears normal         Diaphragm:              Appears normal
Choroid Plexus:        Previously seen        Stomach:                Appears normal, left
sided
Cerebellum:            Previously seen        Abdomen:                Appears normal
Posterior Fossa:       Previously seen        Abdominal Wall:         Previously seen
Nuchal Fold:           Previously seen        Cord Vessels:           Appears normal (3
vessel cord)
Face:                  Orbits prev. seen.     Kidneys:                Appear normal
Prof not well vis.
Lips:                  Previously seen        Bladder:                Appears normal
Palate:                Not well visualized    Spine:                  Previously seen
Heart:                 Appears normal         Upper Extremities:      Previously seen
(4CH, axis, and situs
RVOT:                  Not well visualized    Lower Extremities:      Previously seen
LVOT:                  Appears normal

Other:  Male gender previously seen. Technically difficult due to maternal
habitus and fetal position.
Cervix Uterus Adnexa

Cervix
Length:              3  cm.
Normal appearance by transabdominal scan.
Left Ovary
Previously seen.

Right Ovary
Previously seen
Impression

Single IUP at 23w 0d
Hx of previous 28 week delivery, abruption, FOB with
neurofibromatosis type 1
Limited views of the fetal heart and profile obtained
The remainder of the fetal anatomy appears normal
The estimated fetal weight is at the 39th %tile.
Anterior placenta without previa
Normal amniotic fluid volume
Recommendations

Recommend follow-up ultrasound examination in 4 weeks for
growth and to reevaluate the fetal heart/ profile

## 2016-10-31 ENCOUNTER — Other Ambulatory Visit (INDEPENDENT_AMBULATORY_CARE_PROVIDER_SITE_OTHER): Payer: Medicaid Other | Admitting: *Deleted

## 2016-10-31 VITALS — BP 137/73 | HR 95

## 2016-10-31 DIAGNOSIS — Z0130 Encounter for examination of blood pressure without abnormal findings: Secondary | ICD-10-CM

## 2016-10-31 DIAGNOSIS — Z308 Encounter for other contraceptive management: Secondary | ICD-10-CM

## 2016-10-31 NOTE — Progress Notes (Signed)
Pt here today for BP check one month after starting OCP's.  Pt BP = 137/73, pt has no complaints at this time with the Lo Estrin.  Pt will continue to take OCPs as prescribed and check BP occasionally while at home to ensure that BP is not becoming elevated.  Reviewed BP ranges with the pt.

## 2016-11-23 IMAGING — US US MFM OB FOLLOW-UP
1 series · 14 of 28 positions shown · non-contrast
Comparison: none

[Series 1: us mfm ob follow-up · 61 acquisitions, 14 frames shown]
[im 3/61]
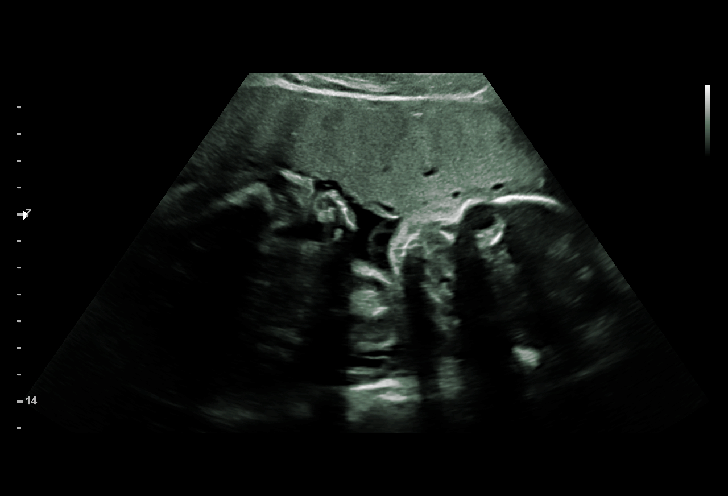
[im 7/61]
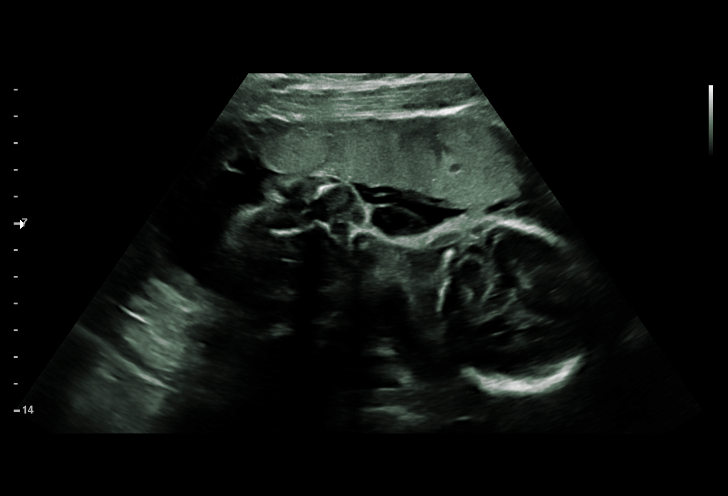
[im 12/61]
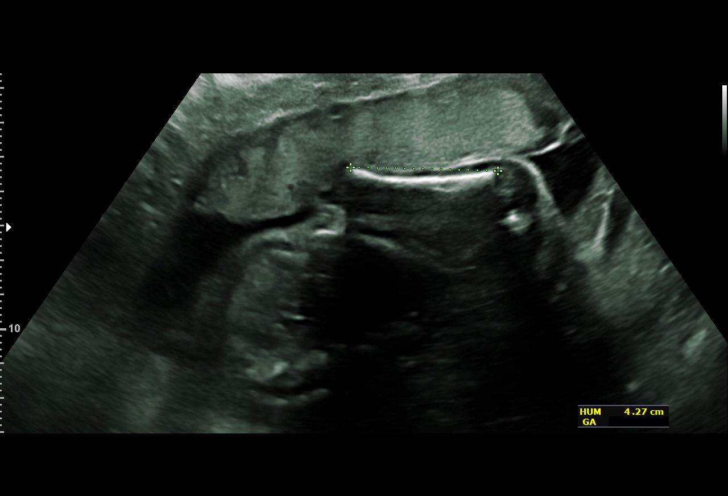
[im 16/61]
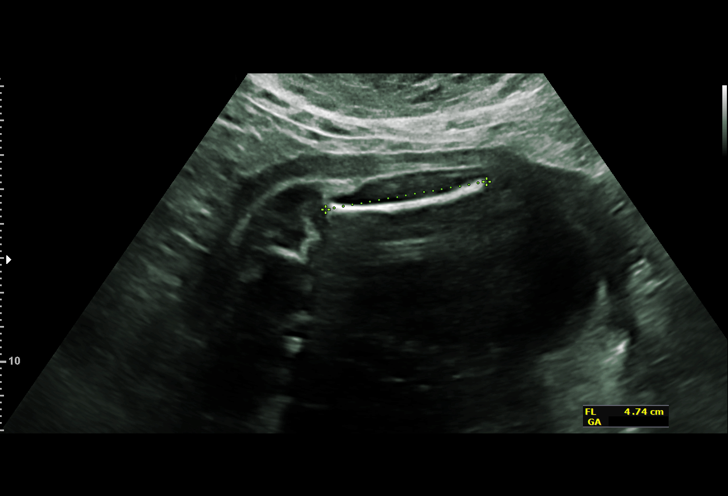
[im 21/61]
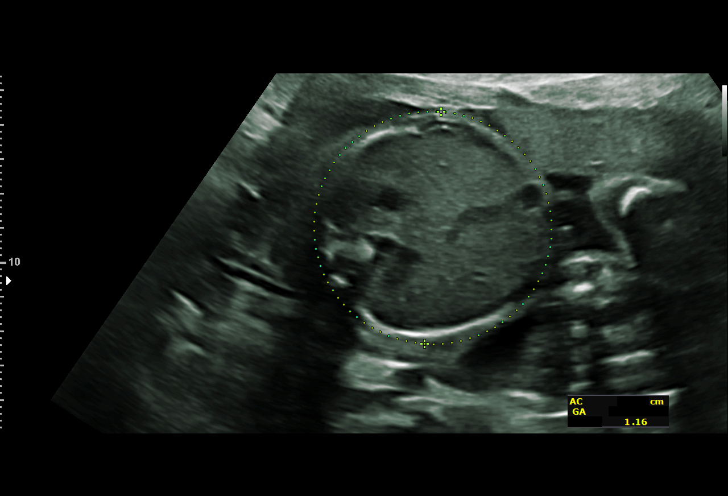
[im 25/61]
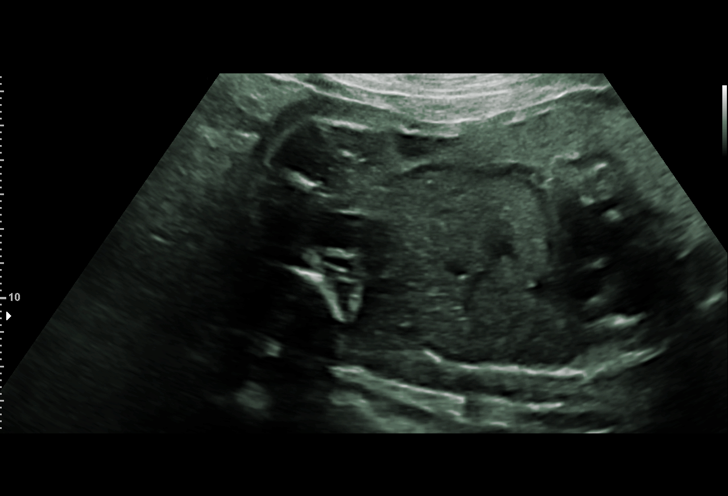
[im 29/61]
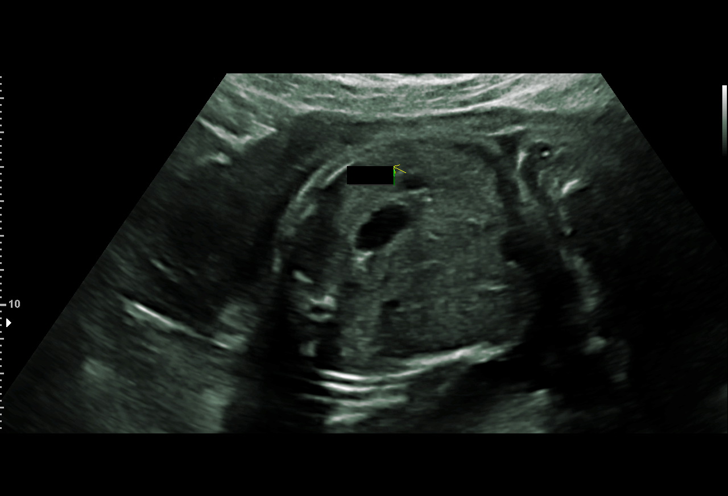
[im 34/61]
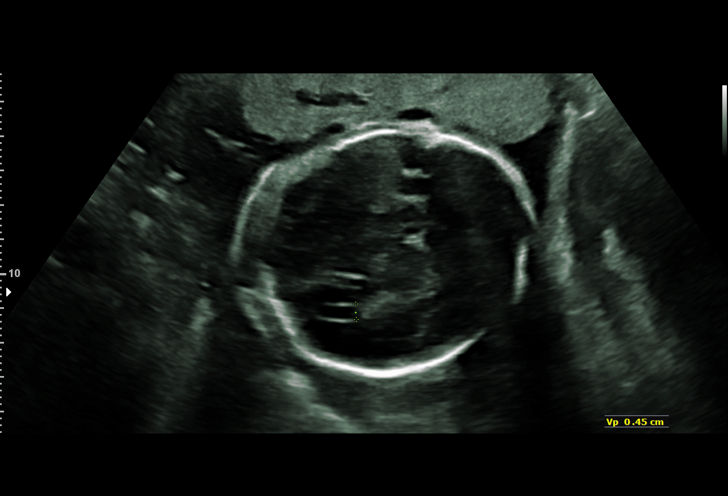
[im 38/61]
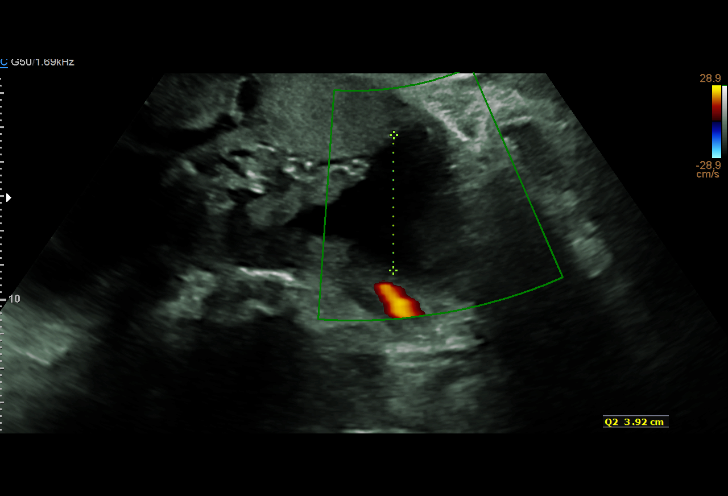
[im 43/61]
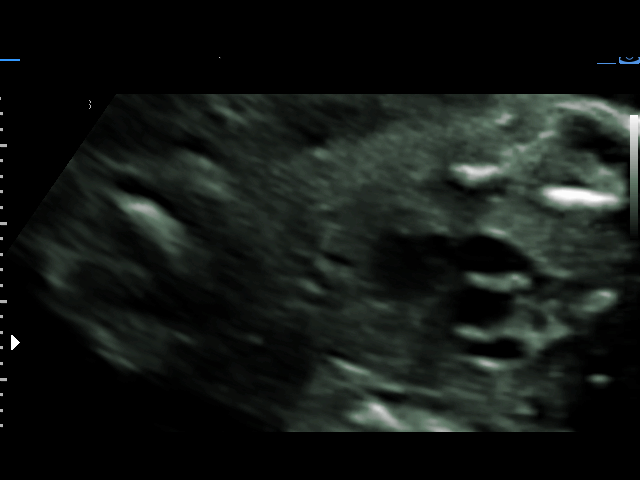
[im 47/61]
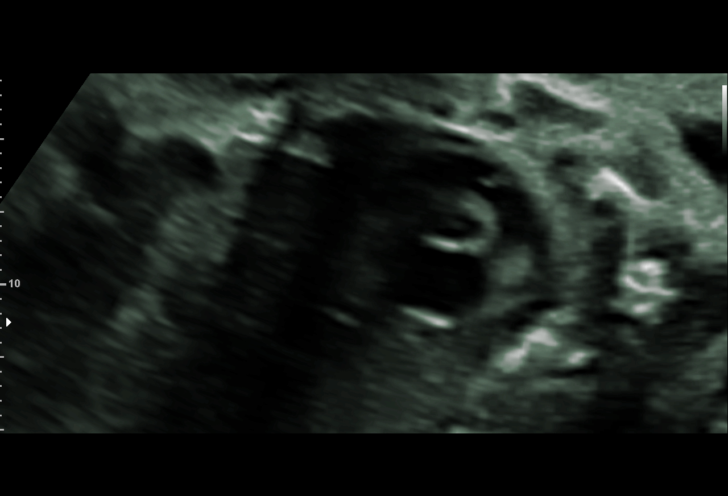
[im 52/61]
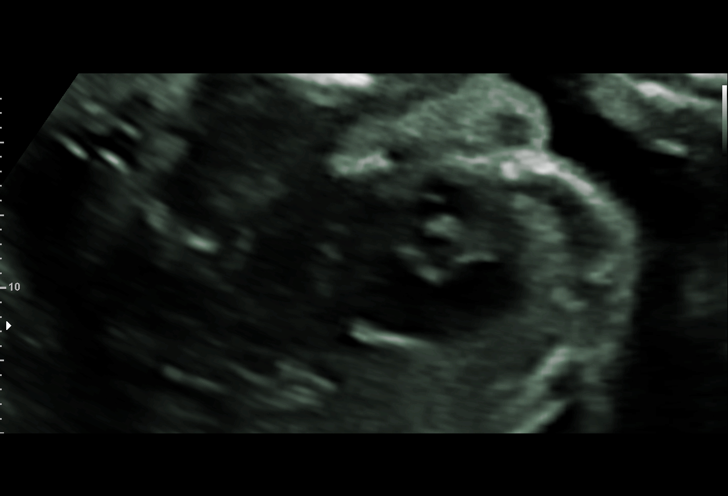
[im 56/61]
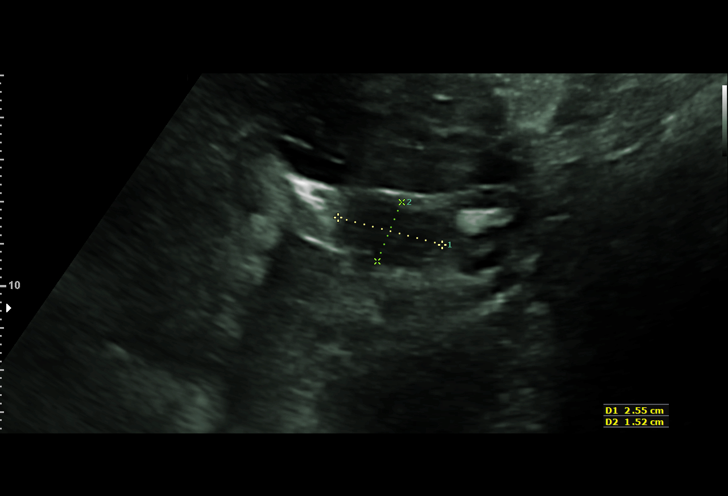
[im 61/61]
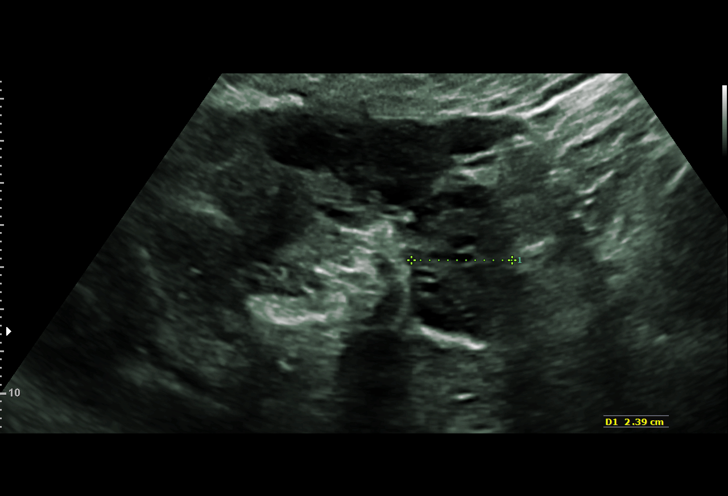

[14 of 28 positions shown; findings below may reference images not displayed]

[REDACTED]

1  BEJTUHLLAHU PICIO            [id number]      [id number]     [id number]
Indications

27 weeks gestation of pregnancy
Poor obstetric history: Previous
preeclampsia / eclampsia/gestational HTN
Obesity complicating pregnancy, second
trimester
History of cesarean delivery, currently
pregnant
Poor obstetric history: Previous preterm
delivery, antepartum (28w 1d)
Family history of genetic disorder (Partner
with Neurofibromatosis Type 1)
Tobacco use complicating pregnancy,
second trimester
Encounter for other antenatal screening
follow-up; complete anatomy
OB History

Blood Type:            Height:  5'5"   Weight (lb):  238      BMI:
Gravidity:    2         Term:   0        Prem:   1        SAB:   0
TOP:          0       Ectopic:  0        Living: 1
Fetal Evaluation

Num Of Fetuses:     1
Fetal Heart         150
Rate(bpm):
Cardiac Activity:   Observed
Presentation:       Cephalic
Placenta:           Anterior, above cervical os
P. Cord Insertion:  Previously Visualized

Amniotic Fluid
AFI FV:      Subjectively within normal limits

AFI Sum(cm)     %Tile       Largest Pocket(cm)
12.18           29

RUQ(cm)                     LUQ(cm)        LLQ(cm)
2.18
Biometry

BPD:      67.3  mm     G. Age:  27w 1d         42  %    CI:        73.82   %   70 - 86
FL/HC:      19.3   %   18.6 -
HC:      248.8  mm     G. Age:  27w 0d         23  %    HC/AC:      1.15       1.05 -
AC:      216.7  mm     G. Age:  26w 1d         19  %    FL/BPD:     71.2   %   71 - 87
FL:       47.9  mm     G. Age:  26w 0d         13  %    FL/AC:      22.1   %   20 - 24
HUM:      44.3  mm     G. Age:  26w 2d         31  %
CER:      31.8  mm     G. Age:  27w 6d         62  %

Est. FW:     911  gm          2 lb      37  %
Gestational Age

LMP:           29w 2d       Date:   11/02/15                 EDD:   08/08/16
U/S Today:     26w 4d                                        EDD:   08/27/16
Best:          27w 0d    Det. By:   Early Ultrasound         EDD:   08/24/16
(02/11/16)
Anatomy

Cranium:               Appears normal         Aortic Arch:            Previously seen
Cavum:                 Previously seen        Ductal Arch:            Previously seen
Ventricles:            Appears normal         Diaphragm:              Appears normal
Choroid Plexus:        Appears normal         Stomach:                Appears normal, left
sided
Cerebellum:            Appears normal         Abdomen:                Appears normal
Posterior Fossa:       Appears normal         Abdominal Wall:         Appears nml (cord
insert, abd wall)
Nuchal Fold:           Previously seen        Cord Vessels:           Appears normal (3
vessel cord)
Face:                  Orbits nl; profile not Kidneys:                Appear normal
well visualized
Lips:                  Appears normal         Bladder:                Appears normal
Thoracic:              Appears normal         Spine:                  Previously seen
Heart:                 Appears normal         Upper Extremities:      Previously seen
(4CH, axis, and situs
RVOT:                  Appears normal         Lower Extremities:      Previously seen
LVOT:                  Appears normal

Other:  Male gender previously seen. Technically difficult due to maternal
habitus and fetal position.
Cervix Uterus Adnexa

Cervix
Length:            3.2  cm.
Normal appearance by transabdominal scan.
Left Ovary
Within normal limits.

Right Ovary
Within normal limits.
Impression

SIUP at 27+0 weeks
Normal interval anatomy; anatomic survey complete except
for profile
Normal amniotic fluid volume
Appropriate interval growth with EFW at the 37th %tile
Recommendations

Serial ultrasounds for growth (not scheduled today)(BMI)

## 2017-02-24 NOTE — Progress Notes (Signed)
Normal pap 09/2016

## 2017-03-01 ENCOUNTER — Encounter: Payer: Self-pay | Admitting: Obstetrics and Gynecology

## 2017-03-01 ENCOUNTER — Ambulatory Visit (INDEPENDENT_AMBULATORY_CARE_PROVIDER_SITE_OTHER): Payer: Medicaid Other | Admitting: Obstetrics and Gynecology

## 2017-03-01 VITALS — BP 141/81 | HR 94 | Wt 223.0 lb

## 2017-03-01 DIAGNOSIS — Z3049 Encounter for surveillance of other contraceptives: Secondary | ICD-10-CM | POA: Diagnosis not present

## 2017-03-01 DIAGNOSIS — Z30017 Encounter for initial prescription of implantable subdermal contraceptive: Secondary | ICD-10-CM

## 2017-03-01 LAB — POCT URINE PREGNANCY: Preg Test, Ur: NEGATIVE

## 2017-03-01 NOTE — Progress Notes (Signed)
Last intercourse was 3 days ago. She uses the OCPs sporadically but uses condoms when she doesn't take the OCPs. Her period is late with LMP 6/29. UPT negative today. Recommend abstinence x 1wk at least and then repeat UPT and nexplanon.   Colleen Nicholson, Jr MD Attending Center for [facility] (Faculty Practice) 03/01/2017 Time: 1111am

## 2017-03-10 ENCOUNTER — Ambulatory Visit (INDEPENDENT_AMBULATORY_CARE_PROVIDER_SITE_OTHER): Payer: Medicaid Other | Admitting: Obstetrics and Gynecology

## 2017-03-10 ENCOUNTER — Encounter: Payer: Self-pay | Admitting: Obstetrics and Gynecology

## 2017-03-10 VITALS — BP 129/84 | HR 85 | Wt 223.0 lb

## 2017-03-10 DIAGNOSIS — N912 Amenorrhea, unspecified: Secondary | ICD-10-CM

## 2017-03-10 DIAGNOSIS — Z3049 Encounter for surveillance of other contraceptives: Secondary | ICD-10-CM | POA: Diagnosis not present

## 2017-03-10 LAB — POCT URINE PREGNANCY: PREG TEST UR: NEGATIVE

## 2017-03-10 NOTE — Procedures (Signed)
Nexplanon Insertion Procedure Note Prior to the procedure being performed, the patient (or guardian) was asked to state their full name, date of birth, type of procedure being performed and the exact location of the operative site. This information was then checked against the documentation in the patient's chart. Prior to the procedure being performed, a "time out" was performed by the physician that confirmed the correct patient, procedure and site.  After informed consent was obtained, the patient's non-dominant left arm was chosen for insertion. A site was marked approximately 8 cm proximal to the medial epicondyle in the sulcus between the biceps and triceps on the inner surface. The area was cleaned with alcohol then local anesthesia was infiltrated with 3 ml of 1% lidocaine along the planned insertion track. The area was prepped with betadine. Using sterile technique the Nexplanon device was inserted per manufacturer's guidelines in the subdermal connective tissue using the standard insertion technique without difficulty. Pressure was applied and the insertion site was hemostatic. The presence of the Nexplanon was confirmed immediately after insertion by palpation by both me and the patient and by checking the tip of needle for the absence of the insert.  A pressure dressing was applied.   The patient tolerated the procedure well.  Kioni Stahl, Jr MD Attending [facility] (Faculty Practice)  

## 2017-03-14 DIAGNOSIS — Z3049 Encounter for surveillance of other contraceptives: Secondary | ICD-10-CM | POA: Diagnosis not present

## 2017-03-14 MED ORDER — ETONOGESTREL 68 MG ~~LOC~~ IMPL
68.0000 mg | DRUG_IMPLANT | Freq: Once | SUBCUTANEOUS | Status: AC
  Administered 2017-03-14: 68 mg via SUBCUTANEOUS

## 2017-03-14 NOTE — Addendum Note (Signed)
Addended by: Arne Cleveland on: 03/14/2017 01:41 PM   Modules accepted: Orders

## 2017-05-30 ENCOUNTER — Telehealth: Payer: Self-pay

## 2017-05-30 NOTE — Telephone Encounter (Signed)
-----   Message from Lindell SparHeather L Bacon, [place] sent at 05/30/2017  1:41 PM EST ----- Regarding: nextplanon Contact: [phone] please call patient she has question about discharge with nextplanon

## 2017-05-30 NOTE — Telephone Encounter (Signed)
Spoke with patient regarding her bleeding when she wipes. I have advised patient she will have some spotting for the first couple of months of having the nexplanon place. Patient voice understanding at this time.

## 2017-08-17 ENCOUNTER — Emergency Department
Admission: EM | Admit: 2017-08-17 | Discharge: 2017-08-17 | Disposition: A | Discharge status: Home / Self Care | Payer: Medicaid Other | Attending: Emergency Medicine | Admitting: Emergency Medicine

## 2017-08-17 ENCOUNTER — Other Ambulatory Visit: Payer: Self-pay

## 2017-08-17 DIAGNOSIS — J029 Acute pharyngitis, unspecified: Secondary | ICD-10-CM | POA: Diagnosis not present

## 2017-08-17 DIAGNOSIS — B349 Viral infection, unspecified: Secondary | ICD-10-CM | POA: Diagnosis not present

## 2017-08-17 DIAGNOSIS — F17210 Nicotine dependence, cigarettes, uncomplicated: Secondary | ICD-10-CM | POA: Insufficient documentation

## 2017-08-17 DIAGNOSIS — Z79899 Other long term (current) drug therapy: Secondary | ICD-10-CM | POA: Insufficient documentation

## 2017-08-17 LAB — GROUP A STREP BY PCR: Group A Strep by PCR: NOT DETECTED

## 2017-08-17 LAB — INFLUENZA PANEL BY PCR (TYPE A & B)
INFLAPCR: NEGATIVE
Influenza B By PCR: NEGATIVE

## 2017-08-17 MED ORDER — MAGIC MOUTHWASH W/LIDOCAINE: 5.0000 mL | Freq: Four times a day (QID) | ORAL | 0 refills | Status: DC

## 2017-08-17 MED ORDER — IBUPROFEN 600 MG PO TABS: 600.0000 mg | ORAL_TABLET | Freq: Three times a day (TID) | ORAL | 0 refills | Status: DC | PRN

## 2017-08-17 MED ORDER — PSEUDOEPH-BROMPHEN-DM 30-2-10 MG/5ML PO SYRP: 5.0000 mL | ORAL_SOLUTION | Freq: Four times a day (QID) | ORAL | 0 refills | Status: DC | PRN

## 2017-08-17 NOTE — ED Notes (Signed)
Reviewed discharge instructions, follow-up care, and prescriptions with patient. Patient verbalized understanding of all information reviewed. Patient stable, with no distress noted at this time.    

## 2017-08-17 NOTE — ED Provider Notes (Signed)
[facility] Emergency Department Provider Note   ____________________________________________   First MD Initiated Contact with Patient 08/17/17 1753     (approximate)  I have reviewed the triage vital signs and the nursing notes.   HISTORY  Chief Complaint Sore Throat    HPI Colleen Nicholson is a 22 y.o. female patient complained of sore throat, throbbing headache, generalized body aches for 2 days.  Patient states chills but no fever.  Patient denies nausea, vomiting, diarrhea.  Patient rates the pain as a 5/10.  No pulses measured for complaint.  Past Medical History:  Diagnosis Date  . Amenorrhea   . History of gestational hypertension 02/11/2016   Baseline P:C ratio 59  . History of placenta abruption 02/11/2016  . History of preterm delivery, currently pregnant in second trimester 02/21/2016  . Obesity   . VBAC (vaginal birth after Cesarean)     Patient Active Problem List   Diagnosis Date Noted  . BMI 38.0-38.9,adult 07/21/2015  . Smoker 07/21/2015    Past Surgical History:  Procedure Laterality Date  . CESAREAN SECTION      Prior to Admission medications   Medication Sig Start Date End Date Taking? Authorizing Provider  brompheniramine-pseudoephedrine-DM 30-2-10 MG/5ML syrup Take 5 mLs by mouth 4 (four) times daily as needed. 08/17/17   Joni ReiningSmith, Salvatore Poe K, PA-C  Etonogestrel (NEXPLANON [place]) Inject into the skin. Inserted 03/10/2017    [provider]  ibuprofen (ADVIL,MOTRIN) 600 MG tablet Take 1 tablet (600 mg total) by mouth every 6 (six) hours. 08/18/16   Renne MuscaWarden, Daniel L, MD  ibuprofen (ADVIL,MOTRIN) 600 MG tablet Take 1 tablet (600 mg total) by mouth every 8 (eight) hours as needed. 08/17/17   Joni ReiningSmith, Savayah Waltrip K, PA-C  magic mouthwash w/lidocaine SOLN Take 5 mLs by mouth 4 (four) times daily. 08/17/17   Joni ReiningSmith, Yazeed Pryer K, PA-C    Allergies Patient has no known allergies.  No family history on file.  Social History Social History     Tobacco Use  . Smoking status: Current Every Day Smoker    Packs/day: 0.50    Types: Cigarettes  . Smokeless tobacco: Never Used  Substance Use Topics  . Alcohol use: No    Alcohol/week: 0.0 oz  . Drug use: No    Review of Systems Constitutional: No fever/chills.  Body ache Eyes: No visual changes. ENT: Sore throat Cardiovascular: Denies chest pain. Respiratory: Denies shortness of breath. Gastrointestinal: No abdominal pain.  No nausea, no vomiting.  No diarrhea.  No constipation. Genitourinary: Negative for dysuria. Musculoskeletal: Negative for back pain. Skin: Negative for rash. Neurological: Positive for headaches, denies focal weakness or numbness.   ____________________________________________   PHYSICAL EXAM:  VITAL SIGNS: ED Triage Vitals  Enc Vitals Group     BP 08/17/17 1725 128/74     Pulse Rate 08/17/17 1725 (!) 103     Resp 08/17/17 1725 15     Temp 08/17/17 1725 98 F (36.7 C)     Temp Source 08/17/17 1725 Oral     SpO2 08/17/17 1725 98 %     Weight 08/17/17 1726 220 lb (99.8 kg)     Height 08/17/17 1726 5\' 5"  (1.651 m)     Head Circumference --      Peak Flow --      Pain Score 08/17/17 1725 5     Pain Loc --      Pain Edu? --      Excl. in GC? --  Constitutional: Alert and oriented. Well appearing and in no acute distress. Nose: Edematous nasal turbinates clear rhinorrhea Mouth/Throat: Mucous membranes are moist.  Oropharynx non-erythematous.  Edematous tonsils without exudate  neck: No stridor.  Hematological/Lymphatic/Immunilogical: No cervical lymphadenopathy. Cardiovascular: Normal rate, regular rhythm. Grossly normal heart sounds.  Good peripheral circulation. Respiratory: Normal respiratory effort.  No retractions. Lungs CTAB. Skin:  Skin is warm, dry and intact. No rash noted. Psychiatric: Mood and affect are normal. Speech and behavior are normal.  ____________________________________________   LABS (all labs ordered are  listed, but only abnormal results are displayed)  Labs Reviewed  GROUP A STREP BY PCR  INFLUENZA PANEL BY PCR (TYPE A & B)   ____________________________________________  EKG   ____________________________________________  RADIOLOGY  ED MD interpretation:    Official radiology report(s): No results found.  ____________________________________________   PROCEDURES  Procedure(s) performed: None  Procedures  Critical Care performed: No  ____________________________________________   INITIAL IMPRESSION / ASSESSMENT AND PLAN / ED COURSE  As part of my medical decision making, I reviewed the following data within the electronic MEDICAL RECORD NUMBER     Viral illness with sore throat.  Discussed negative strep and flu results with patient.  Patient given discharge care instructions .  Patient advised take medication as directed.  Patient [facility]      ____________________________________________   FINAL CLINICAL IMPRESSION(S) / ED DIAGNOSES  Final diagnoses:  Viral illness  Sore throat     ED Discharge Orders        Ordered    magic mouthwash w/lidocaine SOLN  4 times daily     08/17/17 1911    ibuprofen (ADVIL,MOTRIN) 600 MG tablet  Every 8 hours PRN     08/17/17 1911    brompheniramine-pseudoephedrine-DM 30-2-10 MG/5ML syrup  4 times daily PRN     08/17/17 1912       Note:  This document was prepared using Dragon voice recognition software and may include unintentional dictation errors.    Joni Reining, PA-C 08/17/17 1914    Phineas Semen, MD 08/17/17 2007

## 2017-08-17 NOTE — ED Triage Notes (Signed)
Pt c/o sore throat x2 days, throbbing headaches, generalized body aches

## 2017-08-17 NOTE — ED Notes (Signed)
Pt states she has been having severe throat pain since yesterday and some headaches that have been off and on, throat appears red with swollen tonsils.

## 2017-09-03 ENCOUNTER — Other Ambulatory Visit: Payer: Self-pay

## 2017-09-03 DIAGNOSIS — J111 Influenza due to unidentified influenza virus with other respiratory manifestations: Secondary | ICD-10-CM | POA: Insufficient documentation

## 2017-09-03 DIAGNOSIS — R509 Fever, unspecified: Secondary | ICD-10-CM | POA: Diagnosis present

## 2017-09-03 DIAGNOSIS — F17210 Nicotine dependence, cigarettes, uncomplicated: Secondary | ICD-10-CM | POA: Diagnosis not present

## 2017-09-03 LAB — INFLUENZA PANEL BY PCR (TYPE A & B)
Influenza A By PCR: POSITIVE — AB
Influenza B By PCR: NEGATIVE

## 2017-09-03 MED ORDER — IBUPROFEN 800 MG PO TABS
800.0000 mg | ORAL_TABLET | Freq: Once | ORAL | Status: AC
  Administered 2017-09-03: 800 mg via ORAL
  Filled 2017-09-03: qty 1

## 2017-09-03 NOTE — ED Notes (Addendum)
Pt presents with cough, diarrhea yesterday but none today, body aches, sore throat with cough, fever at home 102.6 and headache; talking in complete coherent sentences;

## 2017-09-04 ENCOUNTER — Emergency Department
Admission: EM | Admit: 2017-09-04 | Discharge: 2017-09-04 | Disposition: A | Discharge status: Home / Self Care | Payer: Medicaid Other | Attending: Emergency Medicine | Admitting: Emergency Medicine

## 2017-09-04 DIAGNOSIS — J101 Influenza due to other identified influenza virus with other respiratory manifestations: Secondary | ICD-10-CM

## 2017-09-04 MED ORDER — OSELTAMIVIR PHOSPHATE 75 MG PO CAPS: 75.0000 mg | ORAL_CAPSULE | Freq: Two times a day (BID) | ORAL | 0 refills | Status: AC

## 2017-09-04 NOTE — ED Provider Notes (Signed)
[facility] Emergency Department Provider Note  ____________________________________________   None    (approximate)  I have reviewed the triage vital signs and the nursing notes.   HISTORY  Chief Complaint Cough; Fever; Headache; and Sore Throat    HPI Colleen Nicholson is a 22 y.o. female with medical history as listed below who presents for evaluation of acute onset severe  Viral symptoms that include mild cough, fever, myalgia, general malaise, and 1 episode of diarrhea.  She states that her throat hurts a little bit and that she has a mild headache.  She has no neck pain or stiffness.  Her symptoms are severe and they all started within the last 24 hours.  She says that she did not get a flu vaccination this year.  She is having no difficulty breathing and no chest pain.  Nothing makes her symptoms better or worse.   Past Medical History:  Diagnosis Date  . Amenorrhea   . History of gestational hypertension 02/11/2016   Baseline P:C ratio 59  . History of placenta abruption 02/11/2016  . History of preterm delivery, currently pregnant in second trimester 02/21/2016  . Obesity   . VBAC (vaginal birth after Cesarean)     Patient Active Problem List   Diagnosis Date Noted  . BMI 38.0-38.9,adult 07/21/2015  . Smoker 07/21/2015    Past Surgical History:  Procedure Laterality Date  . CESAREAN SECTION      Prior to Admission medications   Medication Sig Start Date End Date Taking? Authorizing Provider  brompheniramine-pseudoephedrine-DM 30-2-10 MG/5ML syrup Take 5 mLs by mouth 4 (four) times daily as needed. 08/17/17   Joni Reining, PA-C  Etonogestrel (NEXPLANON [place]) Inject into the skin. Inserted 03/10/2017    [provider]  ibuprofen (ADVIL,MOTRIN) 600 MG tablet Take 1 tablet (600 mg total) by mouth every 6 (six) hours. 08/18/16   Renne Musca, MD  ibuprofen (ADVIL,MOTRIN) 600 MG tablet Take 1 tablet (600 mg total) by mouth every 8  (eight) hours as needed. 08/17/17   Joni Reining, PA-C  magic mouthwash w/lidocaine SOLN Take 5 mLs by mouth 4 (four) times daily. 08/17/17   Joni Reining, PA-C  oseltamivir (TAMIFLU) 75 MG capsule Take 1 capsule (75 mg total) by mouth 2 (two) times daily for 5 days. 09/04/17 09/09/17  Loleta Rose, MD    Allergies Patient has no known allergies.  No family history on file.  Social History Social History   Tobacco Use  . Smoking status: Current Every Day Smoker    Packs/day: 0.50    Types: Cigarettes  . Smokeless tobacco: Never Used  Substance Use Topics  . Alcohol use: No    Alcohol/week: 0.0 oz  . Drug use: No    Review of Systems Constitutional: +fever/chills, myalgias, and malaise Eyes: No visual changes. ENT: Mild sore throat but no difficulty swallowing Cardiovascular: Denies chest pain. Respiratory: Denies shortness of breath.  Mild nonproductive cough Gastrointestinal: No abdominal pain.  No nausea, no vomiting.  One episode of diarrhea yesterday.  No constipation. Genitourinary: Negative for dysuria. Musculoskeletal: Negative for neck pain.  Negative for back pain. Integumentary: Negative for rash. Neurological: Mild headache, no focal weakness or numbness.   ____________________________________________   PHYSICAL EXAM:  VITAL SIGNS: ED Triage Vitals  Enc Vitals Group     BP 09/03/17 2310 (!) 115/53     Pulse Rate 09/03/17 2310 (!) 142     Resp 09/03/17 2310 20  Temp 09/03/17 2310 (!) 102.5 F (39.2 C)     Temp Source 09/03/17 2310 Oral     SpO2 09/03/17 2310 98 %     Weight 09/03/17 2310 99.8 kg (220 lb)     Height 09/03/17 2310 1.651 m (5\' 5" )     Head Circumference --      Peak Flow --      Pain Score 09/03/17 2325 7     Pain Loc --      Pain Edu? --      Excl. in GC? --     Constitutional: Alert and oriented.  Generally well-appearing but does appear to have symptoms consistent with viral illness Eyes: Conjunctivae are normal.  Head:  Atraumatic. Nose: +congestion/rhinnorhea. Mouth/Throat: Mucous membranes are moist.  Oropharynx non-erythematous.  No exudate on the tonsils Neck: No stridor.  No meningeal signs.   Cardiovascular: Mild tachycardia, regular rhythm. Good peripheral circulation. Grossly normal heart sounds. Respiratory: Normal respiratory effort.  No retractions. Lungs CTAB. Gastrointestinal: Soft and nontender. No distention.  Musculoskeletal: No lower extremity tenderness nor edema. No gross deformities of extremities. Neurologic:  Normal speech and language. No gross focal neurologic deficits are appreciated.  Skin:  Skin is warm, dry and intact. No rash noted. Psychiatric: Mood and affect are normal. Speech and behavior are normal.  ____________________________________________   LABS (all labs ordered are listed, but only abnormal results are displayed)  Labs Reviewed  INFLUENZA PANEL BY PCR (TYPE A & B) - Abnormal; Notable for the following components:      Result Value   Influenza A By PCR POSITIVE (*)    All other components within normal limits   ____________________________________________  EKG  None - EKG not ordered by ED physician ____________________________________________  RADIOLOGY   ED MD interpretation: No indication for imaging  Official radiology report(s): No results found.  ____________________________________________   PROCEDURES  Critical Care performed: No   Procedure(s) performed:   Procedures   ____________________________________________   INITIAL IMPRESSION / ASSESSMENT AND PLAN / ED COURSE  As part of my medical decision making, I reviewed the following data within the electronic MEDICAL RECORD NUMBER Nursing notes reviewed and incorporated, Labs reviewed  and Notes from prior ED visits    Differential diagnosis includes, but is not limited to, respiratory viral infection including but not limited to influenza, community-acquired pneumonia, much less  likely other infectious process such as UTI or meningitis.  When the patient first arrived she had a significantly elevated pulse but she was also febrile.  She waited for quite a while in the waiting room and I checked her her heart rate was about 110.    Explained her positive influenza A results.  I had my usual and customary influenza discussion including the need to return if she develops any new symptoms that would suggest pneumonia, but at this time her lungs are clear and she has no difficulty breathing and has only had symptoms for about 24 hours.  I had my usual risks and benefits discussion regarding Tamiflu as well, and she prefers to go ahead and get the prescription, so I have prescribed it for her.  I encouraged plenty of oral fluids; I offered an IV for a liter of fluids but she declines in favor of going home to rest.    I gave my usual and customary return precautions.      ____________________________________________  FINAL CLINICAL IMPRESSION(S) / ED DIAGNOSES  Final diagnoses:  Influenza A  MEDICATIONS GIVEN DURING THIS VISIT:  Medications  ibuprofen (ADVIL,MOTRIN) tablet 800 mg (800 mg Oral Given 09/03/17 2330)     ED Discharge Orders        Ordered    oseltamivir (TAMIFLU) 75 MG capsule  2 times daily     09/04/17 0401       Note:  This document was prepared using Dragon voice recognition software and may include unintentional dictation errors.    Loleta Rose, MD 09/04/17 [phone]

## 2017-09-04 NOTE — Discharge Instructions (Signed)
You were diagnosed with the flu (influenza).  You will feel ill for as much as a few weeks.  Please take any prescribed medications as instructed, and you may use over-the-counter Tylenol and/or ibuprofen as needed according to label instructions (unless you have an allergy to either or have been told by your doctor not to take them).  Please make sure to drink plenty of fluids and refer to the included information about rehydration.  Follow up with your physician as instructed above, and return to the Emergency Department (ED) if you are unable to tolerate fluids due to vomiting, have worsening trouble breathing, become extremely tired or difficult to awaken, or if you develop any other symptoms that concern you. 

## 2018-02-26 ENCOUNTER — Other Ambulatory Visit: Payer: Self-pay

## 2018-02-26 ENCOUNTER — Emergency Department: Payer: Medicaid Other

## 2018-02-26 ENCOUNTER — Emergency Department
Admission: EM | Admit: 2018-02-26 | Discharge: 2018-02-26 | Disposition: A | Discharge status: Home / Self Care | Payer: Medicaid Other | Attending: Emergency Medicine | Admitting: Emergency Medicine

## 2018-02-26 ENCOUNTER — Encounter: Payer: Self-pay | Admitting: *Deleted

## 2018-02-26 DIAGNOSIS — F17210 Nicotine dependence, cigarettes, uncomplicated: Secondary | ICD-10-CM | POA: Insufficient documentation

## 2018-02-26 DIAGNOSIS — Z79899 Other long term (current) drug therapy: Secondary | ICD-10-CM | POA: Diagnosis not present

## 2018-02-26 DIAGNOSIS — L02216 Cutaneous abscess of umbilicus: Secondary | ICD-10-CM | POA: Insufficient documentation

## 2018-02-26 DIAGNOSIS — L03316 Cellulitis of umbilicus: Secondary | ICD-10-CM | POA: Diagnosis present

## 2018-02-26 LAB — CBC WITH DIFFERENTIAL/PLATELET
Basophils Absolute: 0.1 10*3/uL (ref 0–0.1)
Basophils Relative: 1 %
Eosinophils Absolute: 0.3 10*3/uL (ref 0–0.7)
Eosinophils Relative: 3 %
HCT: 45.1 % (ref 35.0–47.0)
Hemoglobin: 15.7 g/dL (ref 12.0–16.0)
Lymphocytes Relative: 24 %
Lymphs Abs: 3 10*3/uL (ref 1.0–3.6)
MCH: 32 pg (ref 26.0–34.0)
MCHC: 34.9 g/dL (ref 32.0–36.0)
MCV: 91.8 fL (ref 80.0–100.0)
Monocytes Absolute: 0.6 10*3/uL (ref 0.2–0.9)
Monocytes Relative: 5 %
Neutro Abs: 8.6 10*3/uL — ABNORMAL HIGH (ref 1.4–6.5)
Neutrophils Relative %: 67 %
Platelets: 330 10*3/uL (ref 150–440)
RBC: 4.91 MIL/uL (ref 3.80–5.20)
RDW: 12.7 % (ref 11.5–14.5)
WBC: 12.6 10*3/uL — ABNORMAL HIGH (ref 3.6–11.0)

## 2018-02-26 LAB — COMPREHENSIVE METABOLIC PANEL
ALT: 37 U/L (ref 0–44)
AST: 26 U/L (ref 15–41)
Albumin: 4.4 g/dL (ref 3.5–5.0)
Alkaline Phosphatase: 91 U/L (ref 38–126)
Anion gap: 6 (ref 5–15)
BUN: 14 mg/dL (ref 6–20)
CO2: 25 mmol/L (ref 22–32)
Calcium: 8.9 mg/dL (ref 8.9–10.3)
Chloride: 106 mmol/L (ref 98–111)
Creatinine, Ser: 0.68 mg/dL (ref 0.44–1.00)
GFR calc Af Amer: >60 mL/min (ref >60)
GFR calc non Af Amer: >60 mL/min (ref >60)
Glucose, Bld: 85 mg/dL (ref 70–99)
Potassium: 4 mmol/L (ref 3.5–5.1)
Sodium: 137 mmol/L (ref 135–145)
Total Bilirubin: 0.5 mg/dL (ref 0.3–1.2)
Total Protein: 7.7 g/dL (ref 6.5–8.1)

## 2018-02-26 LAB — POCT PREGNANCY, URINE: Preg Test, Ur: NEGATIVE

## 2018-02-26 MED ORDER — IOHEXOL 300 MG/ML  SOLN
100.0000 mL | Freq: Once | INTRAMUSCULAR | Status: AC | PRN
  Administered 2018-02-26: 100 mL via INTRAVENOUS
  Filled 2018-02-26: qty 100

## 2018-02-26 MED ORDER — CLINDAMYCIN PHOSPHATE 600 MG/50ML IV SOLN
600.0000 mg | Freq: Once | INTRAVENOUS | Status: AC
  Administered 2018-02-26: 600 mg via INTRAVENOUS
  Filled 2018-02-26: qty 50

## 2018-02-26 MED ORDER — IOPAMIDOL (ISOVUE-300) INJECTION 61%
30.0000 mL | Freq: Once | INTRAVENOUS | Status: AC
  Administered 2018-02-26: 30 mL via ORAL
  Filled 2018-02-26: qty 30

## 2018-02-26 MED ORDER — CLINDAMYCIN HCL 300 MG PO CAPS: 300.0000 mg | ORAL_CAPSULE | Freq: Three times a day (TID) | ORAL | 0 refills | Status: AC

## 2018-02-26 NOTE — ED Provider Notes (Signed)
[facility] Emergency Department Provider Note  ____________________________________________  Time seen: Approximately 7:06 PM  I have reviewed the triage vital signs and the nursing notes.   HISTORY  Chief Complaint Cellulitis    HPI Colleen Nicholson is a 22 y.o. female presents to the emergency department with 8 cm of circumferential cellulitis, pain and purulent drainage from umbilicus.  Patient has had symptoms for the past 3 days.  Patient reports a prior episode of mild abdominal cellulitis "several years ago".  She has had one prior C-section but no other abdominal surgeries or abdominal issues.  Patient's pain does not change with sitting or standing position.  Patient does report that she has been more physically active over the past few days than usual.  She denies fever or chills   Past Medical History:  Diagnosis Date  . Amenorrhea   . History of gestational hypertension 02/11/2016   Baseline P:C ratio 59  . History of placenta abruption 02/11/2016  . History of preterm delivery, currently pregnant in second trimester 02/21/2016  . Obesity   . VBAC (vaginal birth after Cesarean)     Patient Active Problem List   Diagnosis Date Noted  . BMI 38.0-38.9,adult 07/21/2015  . Smoker 07/21/2015    Past Surgical History:  Procedure Laterality Date  . CESAREAN SECTION      Prior to Admission medications   Medication Sig Start Date End Date Taking? Authorizing Provider  brompheniramine-pseudoephedrine-DM 30-2-10 MG/5ML syrup Take 5 mLs by mouth 4 (four) times daily as needed. 08/17/17   Joni ReiningSmith, Ronald K, PA-C  Etonogestrel (NEXPLANON [place]) Inject into the skin. Inserted 03/10/2017    [provider]  ibuprofen (ADVIL,MOTRIN) 600 MG tablet Take 1 tablet (600 mg total) by mouth every 6 (six) hours. 08/18/16   Renne MuscaWarden, Daniel L, MD  ibuprofen (ADVIL,MOTRIN) 600 MG tablet Take 1 tablet (600 mg total) by mouth every 8 (eight) hours as needed. 08/17/17    Joni ReiningSmith, Ronald K, PA-C  magic mouthwash w/lidocaine SOLN Take 5 mLs by mouth 4 (four) times daily. 08/17/17   Joni ReiningSmith, Ronald K, PA-C    Allergies Patient has no known allergies.  History reviewed. No pertinent family history.  Social History Social History   Tobacco Use  . Smoking status: Current Every Day Smoker    Packs/day: 0.50    Types: Cigarettes  . Smokeless tobacco: Never Used  Substance Use Topics  . Alcohol use: No    Alcohol/week: 0.0 standard drinks  . Drug use: No     Review of Systems  Constitutional: No fever/chills Eyes: No visual changes. No discharge ENT: No upper respiratory complaints. Cardiovascular: no chest pain. Respiratory: no cough. No SOB. Gastrointestinal: No abdominal pain.  No nausea, no vomiting.  No diarrhea.  No constipation. Genitourinary: Negative for dysuria. No hematuria Musculoskeletal: Negative for musculoskeletal pain. Skin: Patient has abdominal cellulitis   ____________________________________________   PHYSICAL EXAM:  VITAL SIGNS: ED Triage Vitals  Enc Vitals Group     BP 02/26/18 1813 (!) 126/94     Pulse Rate 02/26/18 1813 92     Resp 02/26/18 1813 16     Temp 02/26/18 1813 97.8 F (36.6 C)     Temp Source 02/26/18 1813 Oral     SpO2 02/26/18 1813 100 %     Weight 02/26/18 1814 220 lb 0.3 oz (99.8 kg)     Height 02/26/18 1814 5\' 6"  (1.676 m)     Head Circumference --  Peak Flow --      Pain Score 02/26/18 1813 4     Pain Loc --      Pain Edu? --      Excl. in GC? --      Constitutional: Alert and oriented. Well appearing and in no acute distress. Eyes: Conjunctivae are normal. PERRL. EOMI. Head: Atraumatic. Cardiovascular: Normal rate, regular rhythm. Normal S1 and S2.  Good peripheral circulation. Respiratory: Normal respiratory effort without tachypnea or retractions. Lungs CTAB. Good air entry to the bases with no decreased or absent breath sounds. Gastrointestinal: Bowel sounds 4 quadrants. Soft and  nontender to palpation. No guarding or rigidity. No palpable masses. No distention. No CVA tenderness. Musculoskeletal: Full range of motion to all extremities. No gross deformities appreciated. Neurologic:  Normal speech and language. No gross focal neurologic deficits are appreciated.  Skin: Patient has approximately 8 cm of circumferential abdominal cellulitis surrounding umbilicus.  Patient also has induration and palpable fluctuance at umbilicus with small amount of crusting visualized. Psychiatric: Mood and affect are normal. Speech and behavior are normal. Patient exhibits appropriate insight and judgement.   ____________________________________________   LABS (all labs ordered are listed, but only abnormal results are displayed)  Labs Reviewed  CBC WITH DIFFERENTIAL/PLATELET - Abnormal; Notable for the following components:      Result Value   WBC 12.6 (*)    Neutro Abs 8.6 (*)    All other components within normal limits  COMPREHENSIVE METABOLIC PANEL  POC URINE PREG, ED  POCT PREGNANCY, URINE   ____________________________________________  EKG   ____________________________________________  RADIOLOGY I personally viewed and evaluated these images as part of my medical decision making, as well as reviewing the written report by the radiologist    Ct Abdomen Pelvis W Contrast  Result Date: 02/26/2018 CLINICAL DATA:  Initial evaluation for acute abdominal pain and fever, redness around umbilical region. EXAM: CT ABDOMEN AND PELVIS WITH CONTRAST TECHNIQUE: Multidetector CT imaging of the abdomen and pelvis was performed using the standard protocol following bolus administration of intravenous contrast. CONTRAST:  100mL OMNIPAQUE IOHEXOL 300 MG/ML  SOLN COMPARISON:  None available. FINDINGS: Lower chest: Visualized lung bases are clear. Hepatobiliary: Liver demonstrates a normal contrast enhanced appearance gallbladder within normal limits. No biliary dilatation. Pancreas:  Pancreas within normal limits. Spleen: Spleen within normal limits. Adrenals/Urinary Tract: Adrenal glands are normal. Kidneys equal in size with symmetric enhancement. No nephrolithiasis, hydronephrosis, or focal enhancing renal mass. No hydroureter. Partially distended bladder within normal limits. Stomach/Bowel: Stomach within normal limits. No evidence for bowel obstruction. Normal appendix. No acute inflammatory changes seen about the bowels. Vascular/Lymphatic: Normal intravascular enhancement seen throughout the intra-abdominal aorta. No aneurysm. Mesenteric vessels patent proximally. No adenopathy. Reproductive: Uterus and ovaries within normal limits. Other: No free air or fluid. Focal skin thickening present along the mid anterior abdomen at the level of the umbilicus (series 2, image 49). Findings are nonspecific, but could reflect sequelae of infection/cellulitis in the correct clinical setting. At the umbilicus, there is a subtle hypodense collection measuring approximately 2.1 x 1.0 x 1.4 cm, indeterminate, but could reflect a small seroma or abscess (series 2, image 49). No significant surrounding inflammatory changes. Musculoskeletal: No acute osseous abnormality. No worrisome lytic or blastic osseous lesions. IMPRESSION: 1. Mild diffuse skin thickening involving the mid anterior abdomen, nonspecific, but could reflect sequelae of mild/early infection/cellulitis. Vague 2.1 cm hypodense collection at the level of the umbilicus could reflect a small seroma and/or early abscess. Correlation with physical exam  recommended. 2. No other acute abnormality within the abdomen and pelvis. No findings to suggest acute intra-abdominal infection. Electronically Signed   By: Rise Mu M.D.   On: 02/26/2018 20:54    ____________________________________________    PROCEDURES  Procedure(s) performed:    Procedures    Medications  clindamycin (CLEOCIN) IVPB 600 mg (has no administration in  time range)  iopamidol (ISOVUE-300) 61 % injection 30 mL (30 mLs Oral Contrast Given 02/26/18 1914)  iohexol (OMNIPAQUE) 300 MG/ML solution 100 mL (100 mLs Intravenous Contrast Given 02/26/18 2034)     ____________________________________________   INITIAL IMPRESSION / ASSESSMENT AND PLAN / ED COURSE  Pertinent labs & imaging results that were available during my care of the patient were reviewed by me and considered in my medical decision making (see chart for details).  Review of the [place] CSRS was performed in accordance of the [facility] prior to dispensing any controlled drugs.    Assessment and Plan:  Abdominal abscess/cellulitis Patient presents to the emergency department with approximately 8 cm of cellulitis surrounding umbilicus, which had palpable induration and fluctuance.  CT abdomen and pelvis revealed an approximately 2 cm hypodense fluid accumulation consistent with early abscess.  Mild leukocytosis with associated left shift identified on CBC.  Dr. Tonna Boehringer was consulted regarding patient's case.  Dr. Tonna Boehringer recommended outpatient follow-up.  Patient was given clindamycin in the emergency department tonight and discharged on clindamycin.  Vital signs are reassuring prior to discharge.    ____________________________________________  FINAL CLINICAL IMPRESSION(S) / ED DIAGNOSES  Final diagnoses:  None      NEW MEDICATIONS STARTED DURING THIS VISIT:  ED Discharge Orders    None          This chart was dictated using voice recognition software/Dragon. Despite best efforts to proofread, errors can occur which can change the meaning. Any change was purely unintentional.    Gasper Lloyd 02/26/18 2117    Phineas Semen, MD 02/26/18 2124

## 2018-02-26 NOTE — ED Notes (Signed)
See triage note presents with redness around umbilicus with some swelling. Also noticed some yellow drainage to area  Sx's started on Friday   Denies any fever

## 2018-02-26 NOTE — ED Triage Notes (Signed)
Pt to ED reporting pain around her belly button since Friday. Swelling and redness noted to the skin around pts umbilicus. Yellow discharge noted coming from the inside of umbilicus. Tenderness upon palpation. No fevers reported.

## 2018-03-26 ENCOUNTER — Ambulatory Visit
Admission: EM | Admit: 2018-03-26 | Discharge: 2018-03-26 | Disposition: A | Discharge status: Home / Self Care | Payer: Medicaid Other | Attending: Emergency Medicine | Admitting: Emergency Medicine

## 2018-03-26 ENCOUNTER — Other Ambulatory Visit: Payer: Self-pay

## 2018-03-26 DIAGNOSIS — Z791 Long term (current) use of non-steroidal anti-inflammatories (NSAID): Secondary | ICD-10-CM | POA: Diagnosis not present

## 2018-03-26 DIAGNOSIS — J029 Acute pharyngitis, unspecified: Secondary | ICD-10-CM | POA: Insufficient documentation

## 2018-03-26 DIAGNOSIS — Z79899 Other long term (current) drug therapy: Secondary | ICD-10-CM | POA: Insufficient documentation

## 2018-03-26 DIAGNOSIS — H9203 Otalgia, bilateral: Secondary | ICD-10-CM | POA: Diagnosis not present

## 2018-03-26 DIAGNOSIS — F17210 Nicotine dependence, cigarettes, uncomplicated: Secondary | ICD-10-CM | POA: Diagnosis not present

## 2018-03-26 DIAGNOSIS — K219 Gastro-esophageal reflux disease without esophagitis: Secondary | ICD-10-CM | POA: Diagnosis not present

## 2018-03-26 DIAGNOSIS — R51 Headache: Secondary | ICD-10-CM | POA: Insufficient documentation

## 2018-03-26 LAB — RAPID STREP SCREEN (MED CTR MEBANE ONLY): STREPTOCOCCUS, GROUP A SCREEN (DIRECT): NEGATIVE

## 2018-03-26 MED ORDER — FLUTICASONE PROPIONATE 50 MCG/ACT NA SUSP: 2.0000 | Freq: Every day | NASAL | 0 refills | Status: DC

## 2018-03-26 MED ORDER — IBUPROFEN 600 MG PO TABS: 600.0000 mg | ORAL_TABLET | Freq: Four times a day (QID) | ORAL | 0 refills | Status: DC | PRN

## 2018-03-26 NOTE — ED Triage Notes (Signed)
Patient complains of cough, sore throat, ear pain x 3-4 days ago. Patient states that son was diagnosed with hand, foot and mouth.

## 2018-03-26 NOTE — Discharge Instructions (Addendum)
Rapid strep was negative, so we have sent off a throat culture to make absolutely sure that you do not have an infection that would require antibiotics.  Flonase, Mucinex D, ibuprofen 600 mg to take with 1 g of Tylenol together 3 or 4 times a day as needed for pain, saline nasal irrigation with a Lloyd Huger med sinus rinse and distilled water, Benadryl/Maalox mixture.  Restart Zantac as this may be partially caused by acid reflux.  Here is a list of primary care providers who are taking new patients:  Dr. Elizabeth Sauer, Dr. Schuyler Amor [address] [place] [address] [phone]  [facility] [address] [place] [address]  [phone]  [facility] [address] [place], [place] [address] [phone]  [facility] [address] [place]  [phone] [place], [place] [address]  Here are clinics/ other resources who will see you if you do not have insurance. Some have certain criteria that you must meet. Call them and find out what they are:  [facility]: [address] [place], [place] [address] Phone: [phone] Hours: First and Third Saturdays of each Month, 9 a.m. - 1 p.m.  [facility]: [address], [address] [place], [place] [address] Phone: [phone] Hours: Tuesday, 4 p.m. - 8 p.m. Thursday, 1 p.m. - 8 p.m. Wednesday, 9 a.m. - [facility] [address] [place], [place] [address] Phone: [phone] Pharmacy Phone Number: [phone] Dental Phone Number: [phone] [place] Insurance Help: [phone]  Dental Hours: Monday - Thursday, 8 a.m. - 6 p.m.  [facility] [address] [place], [place] [address] Phone: [phone] Pharmacy Phone Number: [phone] [facility] Insurance Help: [phone]  [facility] [address] [place] [place], [place] [address] Phone: [phone] Pharmacy Phone Number: [phone] [facility] Insurance Help:  [phone]  [facility] [address] [place], [place] [address] Phone: [phone] [facility] Insurance Help: [phone]   [facility] [address] [place], [place] [address] Phone: [phone]  Go to www.goodrx.com to look up your medications. This will give you a list of where you can find your prescriptions at the most affordable prices. Or ask the pharmacist what the cash price is, or if they have any other discount programs available to help make your medication more affordable. This can be less expensive than what you would pay with insurance.

## 2018-03-26 NOTE — ED Provider Notes (Signed)
HPI  SUBJECTIVE:  Patient reports sore throat starting 3 to 4 days ago.  States that pain radiates into both of her ears.  She has tried 200 mg of ibuprofen.  She states that the symptoms are worse in the morning, with eating and with coughing.  She states that her sore throat gets better as the day goes on..  No fever   No neck stiffness  + Cough/URI sxs reports nasal congestion, rhinorrhea, postnasal drip No Myalgias + Headache No Rash,  particularly on the hands or feet, oral ulcers     No Recent Strep or mono exposure,  but she states that her son was diagnosed with hand-foot-and-mouth last week No Abdominal Pain + reflux sxs No Allergy sxs  No Breathing difficulty, voice changes, sensation of throat swelling shut No Drooling No Trismus + abx in past month-took an unknown antibiotic for an umbilical abscess.  No antipyretic in past 4-6 hrs Pt is a smoker. Past medical history of recurrent strep, recurrent otitis media, GERD for which she has taken Zantac, gestational hypertension.  No history of diabetes.   LMP: January/February 2018.  Has a Nexplanon.  Denies the possibility of being pregnant. PMD: None   Past Medical History:  Diagnosis Date  . Amenorrhea   . History of gestational hypertension 02/11/2016   Baseline P:C ratio 59  . History of placenta abruption 02/11/2016  . History of preterm delivery, currently pregnant in second trimester 02/21/2016  . Obesity   . VBAC (vaginal birth after Cesarean)     Past Surgical History:  Procedure Laterality Date  . CESAREAN SECTION      History reviewed. No pertinent family history.  Social History   Tobacco Use  . Smoking status: Current Every Day Smoker    Packs/day: 0.50    Types: Cigarettes  . Smokeless tobacco: Never Used  Substance Use Topics  . Alcohol use: No    Alcohol/week: 0.0 standard drinks  . Drug use: No    No current facility-administered medications for this encounter.   Current Outpatient  Medications:  .  Etonogestrel (NEXPLANON [place]), Inject into the skin. Inserted 03/10/2017, Disp: , Rfl:  .  fluticasone (FLONASE) 50 MCG/ACT nasal spray, Place 2 sprays into both nostrils daily., Disp: 16 g, Rfl: 0 .  ibuprofen (ADVIL,MOTRIN) 600 MG tablet, Take 1 tablet (600 mg total) by mouth every 6 (six) hours as needed., Disp: 30 tablet, Rfl: 0  No Known Allergies   ROS  As noted in HPI.   Physical Exam  BP 133/81 (BP Location: Left Arm)   Pulse 80   Temp 99 F (37.2 C) (Oral)   Resp 18   Ht 5\' 5"  (1.651 m)   Wt 99.8 kg   SpO2 99%   Breastfeeding? No   BMI 36.61 kg/m   Constitutional: Well developed, well nourished, no acute distress Eyes:  EOMI, conjunctiva normal bilaterally HENT: Normocephalic, atraumatic,mucus membranes moist. + nasal congestion erythematous, swollen turbinates + erythematous oropharynx + enlarged tonsils - exudates. Uvula midline.  No intraoral rash Respiratory: Normal inspiratory effort Cardiovascular: Normal rate, no murmurs, rubs, gallops GI: nondistended, nontender. No appreciable splenomegaly skin: No rash or rash on hands, skin intact Lymph: -  Anterior cervical LN.  No posterior cervical lymphadenopathy Musculoskeletal: no deformities Neurologic: Alert & oriented x 3, no focal neuro deficits Psychiatric: Speech and behavior appropriate.  ED Course   Medications - No data to display  Orders Placed This Encounter  Procedures  . Rapid Strep Screen (  [facility] ONLY)    Standing Status:   Standing    Number of Occurrences:   1    Order Specific Question:   Patient immune status    Answer:   Normal  . Culture, group A strep    Standing Status:   Standing    Number of Occurrences:   1    Results for orders placed or performed during the hospital encounter of 03/26/18 (from the past 24 hour(s))  Rapid Strep Screen ([facility] ONLY)     Status: None   Collection Time: 03/26/18  8:40 PM  Result Value Ref Range   Streptococcus,  Group A Screen (Direct) NEGATIVE NEGATIVE   No results found.  ED Clinical Impression  Acute pharyngitis, unspecified etiology   ED Assessment/Plan    Rapid strep negative. Obtaining throat culture to guide antibiotic treatment. Discussed this with patient. We'll contact them if culture is positive, and will call in Appropriate antibiotics.  Presentation consistent with a viral pharyngitis/URI, perhaps worsened by GERD.  Plan to start Flonase, Mucinex D, ibuprofen 600 mg to take with 1 g of Tylenol together 3 or 4 times a day as needed for pain, saline nasal irrigation with a Lloyd Huger med sinus rinse and distilled water, Benadryl Maalox mixture.  Restart Zantac.  Will provide a primary care referral list for ongoing care.  Patient to followup with PMD of choice when necessary, will refer to local primary care resources.  Discussed labs,  MDM, plan and followup with patient. Discussed sn/sx that should prompt return to the ED. patient agrees with plan.   Meds ordered this encounter  Medications  . fluticasone (FLONASE) 50 MCG/ACT nasal spray    Sig: Place 2 sprays into both nostrils daily.    Dispense:  16 g    Refill:  0  . ibuprofen (ADVIL,MOTRIN) 600 MG tablet    Sig: Take 1 tablet (600 mg total) by mouth every 6 (six) hours as needed.    Dispense:  30 tablet    Refill:  0     *This clinic note was created using Scientist, clinical (histocompatibility and immunogenetics). Therefore, there may be occasional mistakes despite careful proofreading.     Domenick Gong, MD 03/27/18 [phone]

## 2018-03-29 LAB — CULTURE, GROUP A STREP (THRC)

## 2018-05-08 ENCOUNTER — Ambulatory Visit
Admission: EM | Admit: 2018-05-08 | Discharge: 2018-05-08 | Disposition: A | Discharge status: Home / Self Care | Payer: Medicaid Other | Attending: Family Medicine | Admitting: Family Medicine

## 2018-05-08 ENCOUNTER — Encounter: Payer: Self-pay | Admitting: Emergency Medicine

## 2018-05-08 ENCOUNTER — Other Ambulatory Visit: Payer: Self-pay

## 2018-05-08 DIAGNOSIS — R198 Other specified symptoms and signs involving the digestive system and abdomen: Secondary | ICD-10-CM

## 2018-05-08 DIAGNOSIS — L988 Other specified disorders of the skin and subcutaneous tissue: Secondary | ICD-10-CM | POA: Insufficient documentation

## 2018-05-08 DIAGNOSIS — L538 Other specified erythematous conditions: Secondary | ICD-10-CM | POA: Insufficient documentation

## 2018-05-08 MED ORDER — DOXYCYCLINE HYCLATE 100 MG PO CAPS: 100.0000 mg | ORAL_CAPSULE | Freq: Two times a day (BID) | ORAL | 0 refills | Status: DC

## 2018-05-08 MED ORDER — FLUCONAZOLE 150 MG PO TABS: 150.0000 mg | ORAL_TABLET | ORAL | 0 refills | Status: DC

## 2018-05-08 NOTE — ED Provider Notes (Signed)
[facility]    CSN: [id number] Arrival date & time: 05/08/18  1605  History   Chief Complaint Chief Complaint  Patient presents with  . Cellulitis   HPI   22 year old female presents with concerns for cellulitis.  Patient reports a 2-day history of pain in her umbilicus.  She reports redness and some discharge.  She reports that it is malodorous.  No fever.  No chills.  She states that it feels like her prior episodes of cellulitis, just in the early stages.  No medications or interventions tried.  No other associated symptoms.  No other complaints.  PMH, Surgical Hx, Family Hx, Social History reviewed and updated as below.  Past Medical History:  Diagnosis Date  . Amenorrhea   . History of gestational hypertension 02/11/2016   Baseline P:C ratio 59  . History of placenta abruption 02/11/2016  . History of preterm delivery, currently pregnant in second trimester 02/21/2016  . Obesity   . VBAC (vaginal birth after Cesarean)     Patient Active Problem List   Diagnosis Date Noted  . BMI 38.0-38.9,adult 07/21/2015  . Smoker 07/21/2015   Past Surgical History:  Procedure Laterality Date  . CESAREAN SECTION     OB History    Gravida  2   Para  2   Term  1   Preterm  1   AB      Living  2     SAB      TAB      Ectopic      Multiple  0   Live Births  2          Home Medications    Prior to Admission medications   Medication Sig Start Date End Date Taking? Authorizing Provider  Etonogestrel (NEXPLANON [place]) Inject into the skin. Inserted 03/10/2017   Yes [provider]  ibuprofen (ADVIL,MOTRIN) 600 MG tablet Take 1 tablet (600 mg total) by mouth every 6 (six) hours as needed. 03/26/18  Yes Domenick Gong, MD  doxycycline (VIBRAMYCIN) 100 MG capsule Take 1 capsule (100 mg total) by mouth 2 (two) times daily. 05/08/18   Tommie Sams, DO  fluconazole (DIFLUCAN) 150 MG tablet Take 1 tablet (150 mg total) by mouth once a week. 05/08/18    Tommie Sams, DO   Family History Family History  Problem Relation Age of Onset  . Asthma Mother    Social History Social History   Tobacco Use  . Smoking status: Current Every Day Smoker    Packs/day: 0.50    Types: Cigarettes  . Smokeless tobacco: Never Used  Substance Use Topics  . Alcohol use: No    Alcohol/week: 0.0 standard drinks  . Drug use: No   Allergies   Patient has no known allergies.  Review of Systems Review of Systems  Constitutional: Negative for fever.  Skin:       Redness, drainage (umbilicus).    Physical Exam Triage Vital Signs ED Triage Vitals  Enc Vitals Group     BP 05/08/18 1616 (!) 143/80     Pulse Rate 05/08/18 1616 98     Resp 05/08/18 1616 16     Temp 05/08/18 1616 99.8 F (37.7 C)     Temp Source 05/08/18 1616 Oral     SpO2 05/08/18 1616 100 %     Weight 05/08/18 1617 220 lb (99.8 kg)     Height 05/08/18 1617 5\' 5"  (1.651 m)     Head Circumference --  Peak Flow --      Pain Score 05/08/18 1616 6     Pain Loc --      Pain Edu? --      Excl. in GC? --    Updated Vital Signs BP (!) 143/80 (BP Location: Left Arm)   Pulse 98   Temp 99.8 F (37.7 C) (Oral)   Resp 16   Ht 5\' 5"  (1.651 m)   Wt 99.8 kg   SpO2 100%   BMI 36.61 kg/m   Visual Acuity Right Eye Distance:   Left Eye Distance:   Bilateral Distance:    Right Eye Near:   Left Eye Near:    Bilateral Near:     Physical Exam  Constitutional: She is oriented to person, place, and time. She appears well-developed. No distress.  HENT:  Head: Normocephalic and atraumatic.  Pulmonary/Chest: Effort normal. No respiratory distress.  Abdominal: Soft. She exhibits no distension. There is no tenderness.  Neurological: She is alert and oriented to person, place, and time.  Skin:  Patient with an area of erythema inside the umbilicus.  Patient also has some mild discharge.  Psychiatric: She has a normal mood and affect. Her behavior is normal.  Nursing note and vitals  reviewed.  UC Treatments / Results  Labs (all labs ordered are listed, but only abnormal results are displayed) Labs Reviewed  AEROBIC CULTURE (SUPERFICIAL SPECIMEN)    EKG None  Radiology No results found.  Procedures Procedures (including critical care time)  Medications Ordered in UC Medications - No data to display  Initial Impression / Assessment and Plan / UC Course  I have reviewed the triage vital signs and the nursing notes.  Pertinent labs & imaging results that were available during my care of the patient were reviewed by me and considered in my medical decision making (see chart for details).    22 year old female presents with umbilical erythema and discharge.  Mild.  Yeast versus bacterial.  Placing on doxycycline given history of cellulitis.  Diflucan to cover for yeast.   Final Clinical Impressions(s) / UC Diagnoses   Final diagnoses:  Umbilicus discharge     Discharge Instructions     Meds as prescribed.  Please let us know if you worsen or fail to improve  Take care  Dr. Adriana Simas    ED Prescriptions    Medication Sig Dispense Auth. Provider   doxycycline (VIBRAMYCIN) 100 MG capsule Take 1 capsule (100 mg total) by mouth 2 (two) times daily. 14 capsule Dejour Vos G, DO   fluconazole (DIFLUCAN) 150 MG tablet Take 1 tablet (150 mg total) by mouth once a week. 4 tablet Tommie Sams, DO     Controlled Substance Prescriptions [place] Controlled Substance Registry consulted? Not Applicable   Tommie Sams, DO 05/08/18 1806

## 2018-05-08 NOTE — Discharge Instructions (Signed)
Meds as prescribed.  Please let us know if you worsen or fail to improve  Take care  Dr. Adriana Simas

## 2018-05-08 NOTE — ED Triage Notes (Signed)
Pt c/o pain in her umbilical area. Started 2 days ago. She has had cellulitis in this area before and feels similar to how it began int he past.

## 2018-05-11 LAB — AEROBIC CULTURE W GRAM STAIN (SUPERFICIAL SPECIMEN)

## 2018-05-11 LAB — AEROBIC CULTURE  (SUPERFICIAL SPECIMEN)
CULTURE: NORMAL
SPECIAL REQUESTS: NORMAL

## 2018-08-19 ENCOUNTER — Other Ambulatory Visit: Payer: Self-pay

## 2018-08-19 ENCOUNTER — Encounter: Payer: Self-pay | Admitting: Gynecology

## 2018-08-19 ENCOUNTER — Ambulatory Visit
Admission: EM | Admit: 2018-08-19 | Discharge: 2018-08-19 | Disposition: A | Discharge status: Home / Self Care | Payer: Medicaid Other | Attending: Family Medicine | Admitting: Family Medicine

## 2018-08-19 DIAGNOSIS — R300 Dysuria: Secondary | ICD-10-CM

## 2018-08-19 DIAGNOSIS — N3001 Acute cystitis with hematuria: Secondary | ICD-10-CM

## 2018-08-19 LAB — URINALYSIS, COMPLETE (UACMP) WITH MICROSCOPIC
Bilirubin Urine: NEGATIVE
Glucose, UA: NEGATIVE mg/dL
Ketones, ur: NEGATIVE mg/dL
Nitrite: NEGATIVE
Protein, ur: NEGATIVE mg/dL
SPECIFIC GRAVITY, URINE: 1.025 (ref 1.005–1.030)
pH: 7 (ref 5.0–8.0)

## 2018-08-19 MED ORDER — NITROFURANTOIN MONOHYD MACRO 100 MG PO CAPS: 100.0000 mg | ORAL_CAPSULE | Freq: Two times a day (BID) | ORAL | 0 refills | Status: AC

## 2018-08-19 NOTE — ED Triage Notes (Signed)
Patient c/o painful urination x 2-3 days ago.

## 2018-08-19 NOTE — Discharge Instructions (Addendum)
Recommend start Macrobid 100mg  twice a day as directed. Continue to push fluids- especially water and cranberry juice. Follow-up pending urine culture results and in 3 to 4 days if not improving.

## 2018-08-19 NOTE — ED Provider Notes (Signed)
[facility]    CSN: [id number] Arrival date & time: 08/19/18  1215     History   Chief Complaint No chief complaint on file.   HPI Colleen Nicholson is a 23 y.o. female.   23 year old female presents with dysuria for the past 2 to 3 days. Also has seen some blood when wiping. Denies any fever, lower abdomina pain, back pain or GI symptoms. Has noticed slight increased in thin clear vaginal discharge. Has not taken any medication for symptoms. No previous hx of UTI. Currently has Nexplanon in place. Does not have periods. Currently sexually active with 1 partner. No other chronic health issues. Takes no daily medication.   The history is provided by the patient.    Past Medical History:  Diagnosis Date  . Amenorrhea   . History of gestational hypertension 02/11/2016   Baseline P:C ratio 59  . History of placenta abruption 02/11/2016  . History of preterm delivery, currently pregnant in second trimester 02/21/2016  . Obesity   . VBAC (vaginal birth after Cesarean)     Patient Active Problem List   Diagnosis Date Noted  . BMI 38.0-38.9,adult 07/21/2015  . Smoker 07/21/2015    Past Surgical History:  Procedure Laterality Date  . CESAREAN SECTION      OB History    Gravida  2   Para  2   Term  1   Preterm  1   AB      Living  2     SAB      TAB      Ectopic      Multiple  0   Live Births  2            Home Medications    Prior to Admission medications   Medication Sig Start Date End Date Taking? Authorizing Provider  ibuprofen (ADVIL,MOTRIN) 600 MG tablet Take 1 tablet (600 mg total) by mouth every 6 (six) hours as needed. 03/26/18  Yes Domenick Gong, MD  Etonogestrel [facility]) Inject into the skin. Inserted 03/10/2017    [provider]  nitrofurantoin, macrocrystal-monohydrate, (MACROBID) 100 MG capsule Take 1 capsule (100 mg total) by mouth 2 (two) times daily for 5 days. 08/19/18 08/24/18  Sudie Grumbling, NP     Family History Family History  Problem Relation Age of Onset  . Asthma Mother     Social History Social History   Tobacco Use  . Smoking status: Current Every Day Smoker    Packs/day: 0.50    Types: Cigarettes  . Smokeless tobacco: Never Used  Substance Use Topics  . Alcohol use: No    Alcohol/week: 0.0 standard drinks  . Drug use: No     Allergies   Patient has no known allergies.   Review of Systems Review of Systems  Constitutional: Negative for activity change, appetite change, chills, fatigue and fever.  HENT: Negative for hearing loss, sore throat and trouble swallowing.   Respiratory: Negative for cough, chest tightness and shortness of breath.   Gastrointestinal: Negative for abdominal pain, diarrhea, nausea and vomiting.  Genitourinary: Positive for dysuria, frequency, hematuria and vaginal discharge (thin clear). Negative for decreased urine volume, difficulty urinating, flank pain, genital sores, pelvic pain, vaginal bleeding and vaginal pain.  Musculoskeletal: Negative for arthralgias, back pain and myalgias.  Skin: Negative for color change, rash and wound.  Neurological: Negative for dizziness, tremors, seizures, syncope, weakness, light-headedness, numbness and headaches.  Hematological: Negative for adenopathy. Does  not bruise/bleed easily.     Physical Exam Triage Vital Signs ED Triage Vitals  Enc Vitals Group     BP 08/19/18 1226 133/81     Pulse Rate 08/19/18 1226 85     Resp 08/19/18 1226 18     Temp 08/19/18 1226 98.6 F (37 C)     Temp Source 08/19/18 1226 Oral     SpO2 08/19/18 1226 99 %     Weight 08/19/18 1225 230 lb (104.3 kg)     Height 08/19/18 1225 5\' 5"  (1.651 m)     Head Circumference --      Peak Flow --      Pain Score 08/19/18 1225 7     Pain Loc --      Pain Edu? --      Excl. in GC? --    No data found.  Updated Vital Signs BP 133/81 (BP Location: Left Arm)   Pulse 85   Temp 98.6 F (37 C) (Oral)   Resp 18    Ht 5\' 5"  (1.651 m)   Wt 230 lb (104.3 kg)   SpO2 99%   BMI 38.27 kg/m   Visual Acuity Right Eye Distance:   Left Eye Distance:   Bilateral Distance:    Right Eye Near:   Left Eye Near:    Bilateral Near:     Physical Exam Vitals signs and nursing note reviewed.  Constitutional:      General: She is awake. She is not in acute distress.    Appearance: Normal appearance. She is well-developed, well-groomed and overweight. She is not ill-appearing.     Comments: Patient sitting comfortably on exam table in no acute distress.   HENT:     Head: Normocephalic and atraumatic.     Right Ear: External ear normal.     Left Ear: External ear normal.  Eyes:     Extraocular Movements: Extraocular movements intact.     Conjunctiva/sclera: Conjunctivae normal.  Neck:     Musculoskeletal: Normal range of motion.  Cardiovascular:     Rate and Rhythm: Normal rate and regular rhythm.     Heart sounds: Normal heart sounds.  Pulmonary:     Effort: Pulmonary effort is normal. No respiratory distress.     Breath sounds: Normal breath sounds.  Abdominal:     General: Abdomen is flat. Bowel sounds are normal. There is no distension.     Palpations: Abdomen is soft.     Tenderness: There is abdominal tenderness in the suprapubic area. There is no right CVA tenderness, left CVA tenderness, guarding or rebound.  Musculoskeletal: Normal range of motion.  Skin:    General: Skin is warm and dry.  Neurological:     General: No focal deficit present.     Mental Status: She is alert and oriented to person, place, and time.  Psychiatric:        Mood and Affect: Mood normal.        Behavior: Behavior normal. Behavior is cooperative.        Thought Content: Thought content normal.        Judgment: Judgment normal.      UC Treatments / Results  Labs (all labs ordered are listed, but only abnormal results are displayed) Labs Reviewed  URINALYSIS, COMPLETE (UACMP) WITH MICROSCOPIC - Abnormal;  Notable for the following components:      Result Value   APPearance HAZY (*)    Hgb urine dipstick SMALL (*)  Leukocytes, UA SMALL (*)    Bacteria, UA FEW (*)    All other components within normal limits  URINE CULTURE    EKG None  Radiology No results found.  Procedures Procedures (including critical care time)  Medications Ordered in UC Medications - No data to display  Initial Impression / Assessment and Plan / UC Course  I have reviewed the triage vital signs and the nursing notes.  Pertinent labs & imaging results that were available during my care of the patient were reviewed by me and considered in my medical decision making (see chart for details).    Reviewed urinalysis results with patient- discussed possible UTI. Will start Macrobid 100mg  twice a day as directed. Sent urine for culture. Continue to increase water and cranberry juice. Thin, clear vaginal discharge without odor may be normal- continue to monitor.  Follow-up pending urine culture results and in 3 to 4 days if not improving.   Final Clinical Impressions(s) / UC Diagnoses   Final diagnoses:  Dysuria  Acute cystitis with hematuria     Discharge Instructions     Recommend start Macrobid 100mg  twice a day as directed. Continue to push fluids- especially water and cranberry juice. Follow-up pending urine culture results and in 3 to 4 days if not improving.     ED Prescriptions    Medication Sig Dispense Auth. Provider   nitrofurantoin, macrocrystal-monohydrate, (MACROBID) 100 MG capsule Take 1 capsule (100 mg total) by mouth 2 (two) times daily for 5 days. 10 capsule Sudie Grumbling, NP     Controlled Substance Prescriptions [place] Controlled Substance Registry consulted? Not Applicable   Sudie Grumbling, NP 08/20/18 [phone]

## 2018-08-21 LAB — URINE CULTURE: Special Requests: NORMAL

## 2018-08-27 IMAGING — CT CT ABD-PELV W/ CM
2 of 4 series · 15 of 46 positions shown, 17 images · IV contrast (APPLIED)
Comparison: None available.

CLINICAL DATA: Initial evaluation for acute abdominal pain and
fever, redness around umbilical region.

EXAM:
CT ABDOMEN AND PELVIS WITH CONTRAST
TECHNIQUE: Multidetector CT imaging of the abdomen and pelvis was performed
using the standard protocol following bolus administration of
intravenous contrast.
CONTRAST:  100mL OMNIPAQUE IOHEXOL 300 MG/ML  SOLN

[Series 2: routine abd/pel with · axial · 0.77mm/px · z∈[-1228,-793]mm · 12 of 97 slices shown, 14 images]
[im 5/97  soft-tissue]
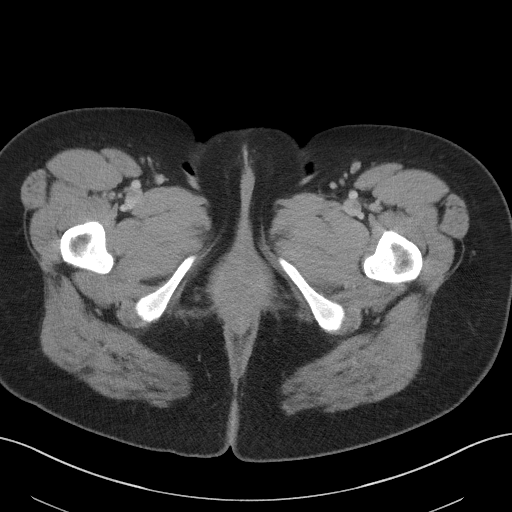
[im 5/97  bone]
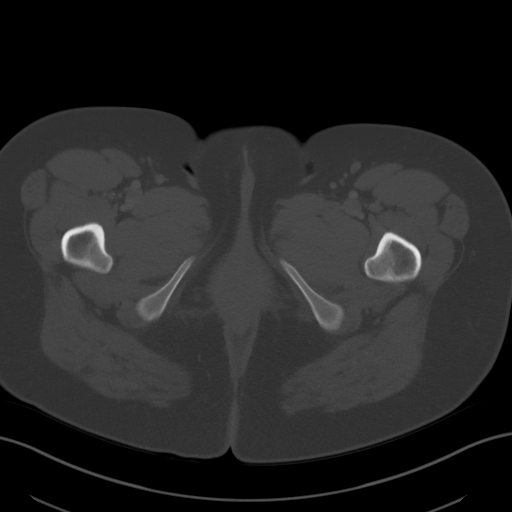
[im 13/97  soft-tissue]
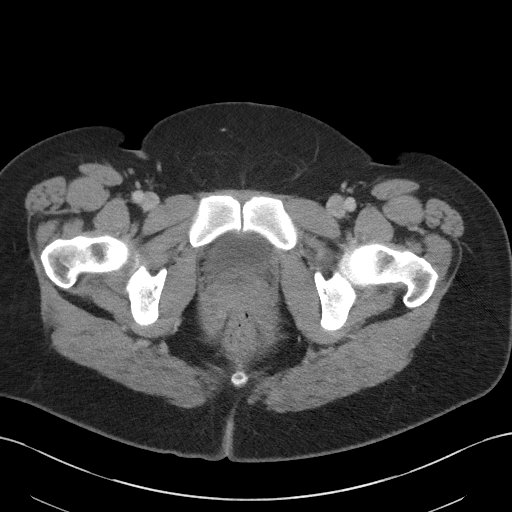
[im 21/97  soft-tissue]
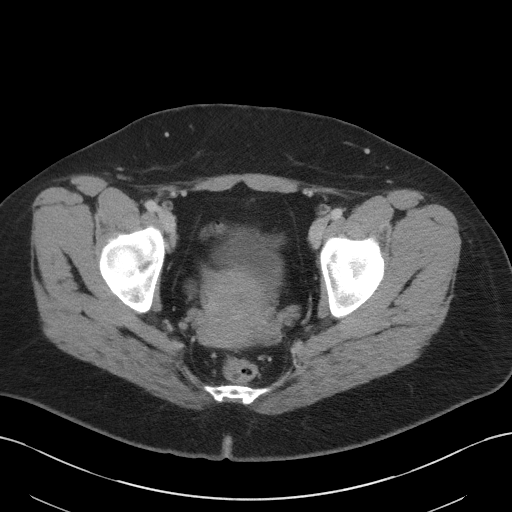
[im 30/97  soft-tissue]
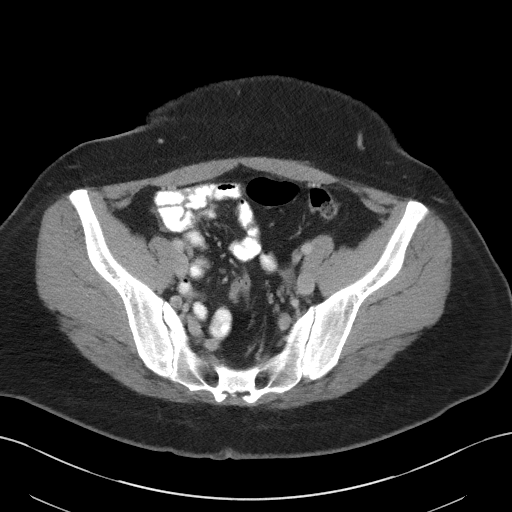
[im 38/97  soft-tissue]
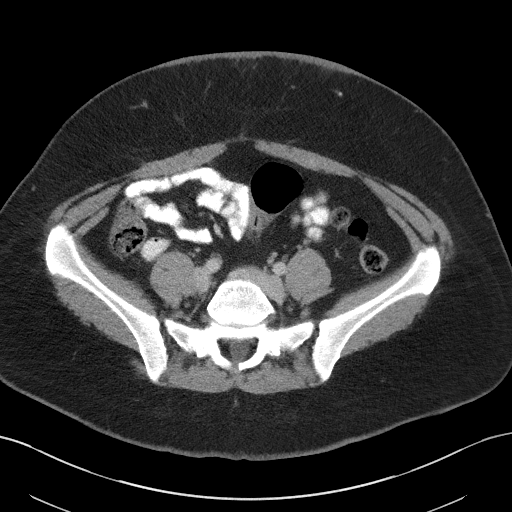
[im 46/97  soft-tissue]
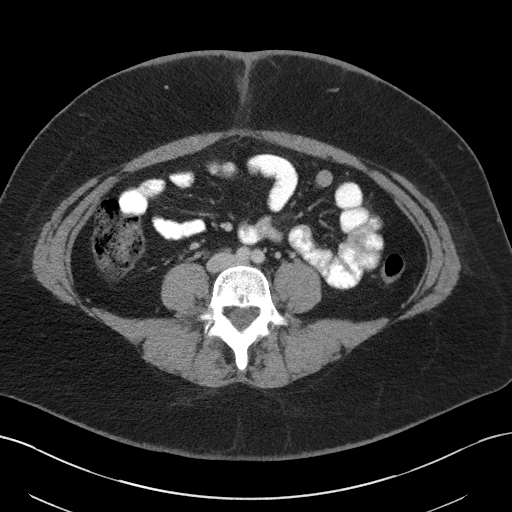
[im 51/97  soft-tissue]
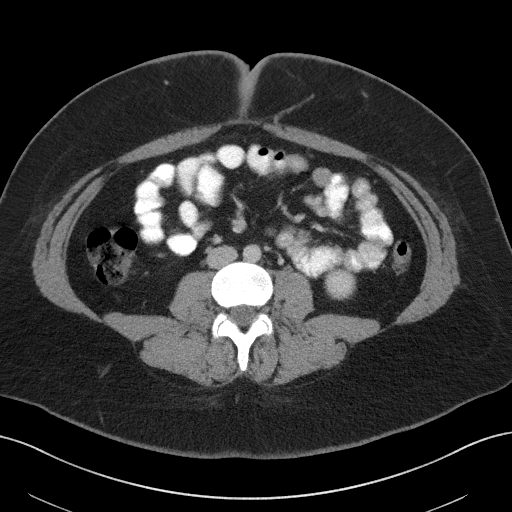
[im 59/97  soft-tissue]
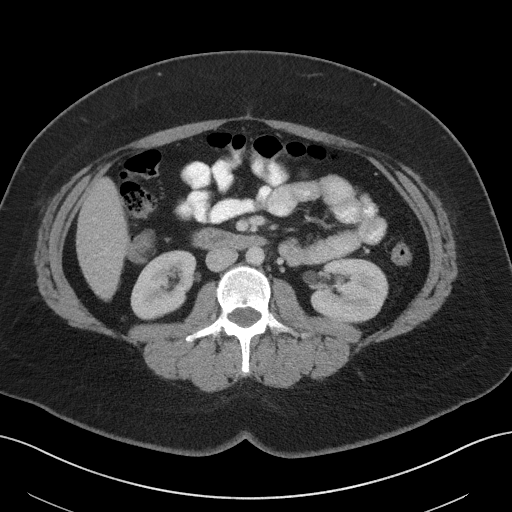
[im 67/97  soft-tissue]
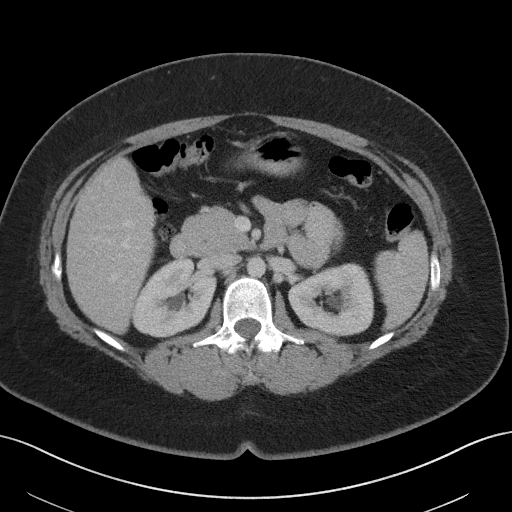
[im 67/97  bone]
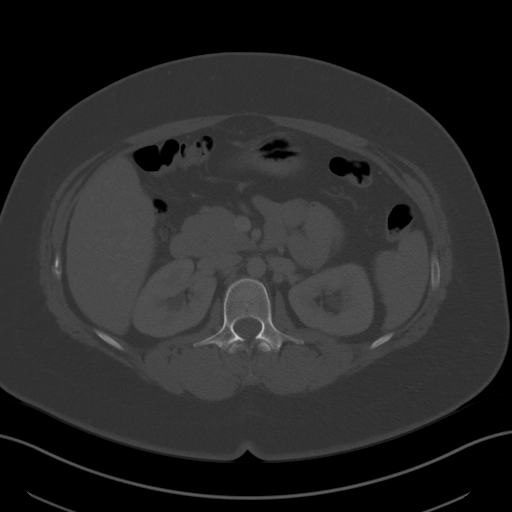
[im 76/97  soft-tissue]
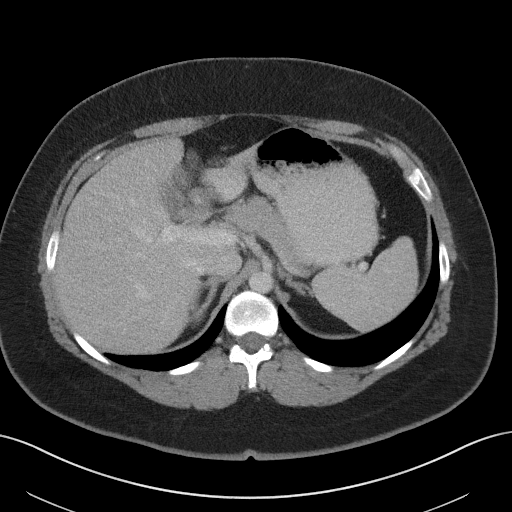
[im 84/97  soft-tissue]
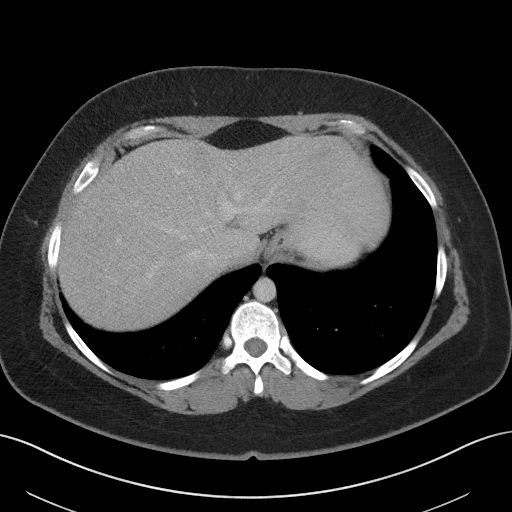
[im 92/97  soft-tissue]
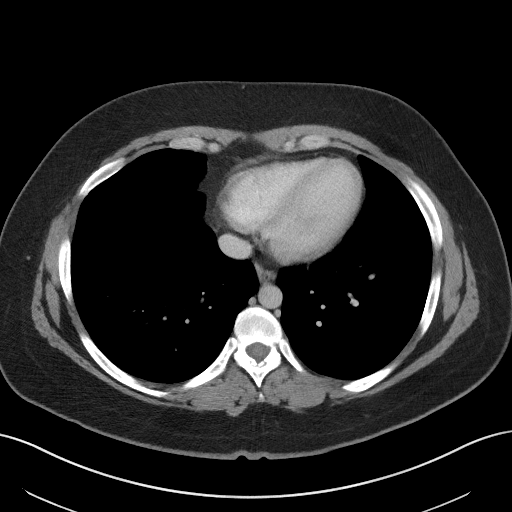

[Series 5: coronal st · coronal · 0.73mm/px · 3 of 79 slices shown]
[im 27/79  soft-tissue]
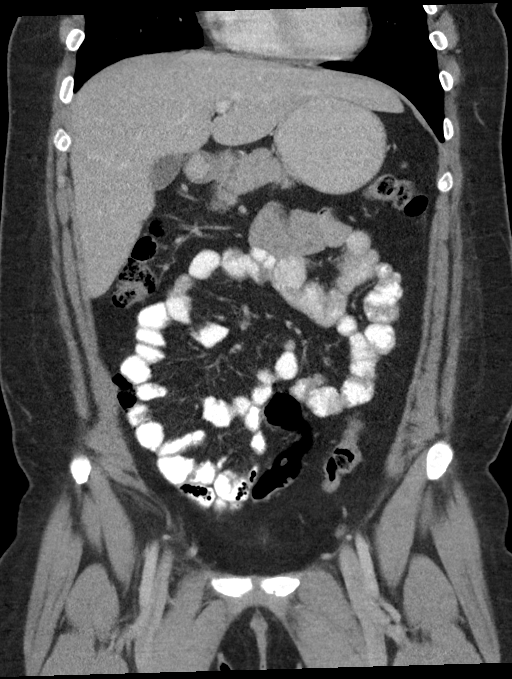
[im 35/79  soft-tissue]
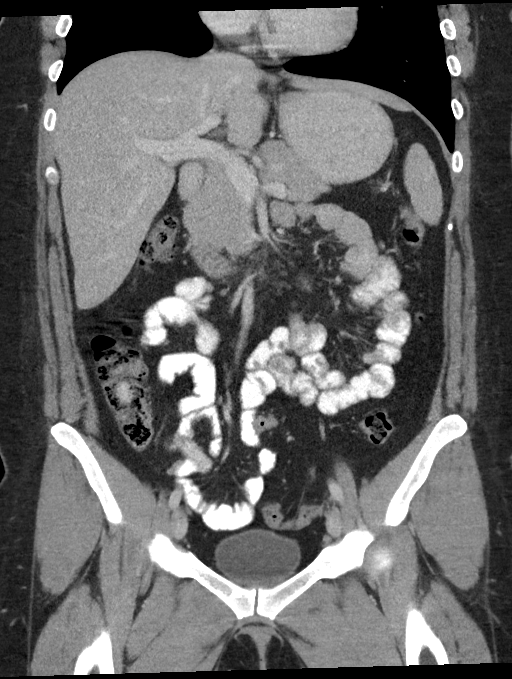
[im 44/79  soft-tissue]
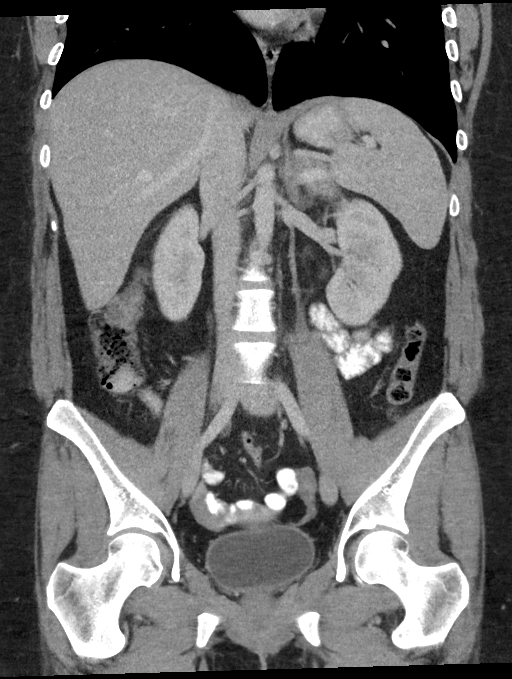

[15 of 46 positions shown; findings below may reference images not displayed]

FINDINGS: Lower chest: Visualized lung bases are clear.

Hepatobiliary: Liver demonstrates a normal contrast enhanced
appearance gallbladder within normal limits. No biliary dilatation.

Pancreas: Pancreas within normal limits.

Spleen: Spleen within normal limits.

Adrenals/Urinary Tract: Adrenal glands are normal. Kidneys equal in
size with symmetric enhancement. No nephrolithiasis, hydronephrosis,
or focal enhancing renal mass. No hydroureter. Partially distended
bladder within normal limits.

Stomach/Bowel: Stomach within normal limits. No evidence for bowel
obstruction. Normal appendix. No acute inflammatory changes seen
about the bowels.

Vascular/Lymphatic: Normal intravascular enhancement seen throughout
the intra-abdominal aorta. No aneurysm. Mesenteric vessels patent
proximally. No adenopathy.

Reproductive: Uterus and ovaries within normal limits.

Other: No free air or fluid.

Focal skin thickening present along the mid anterior abdomen at the
level of the umbilicus (series 2, image 49). Findings are
nonspecific, but could reflect sequelae of infection/cellulitis in
the correct clinical setting. At the umbilicus, there is a subtle
hypodense collection measuring approximately 2.1 x 1.0 x 1.4 cm,
indeterminate, but could reflect a small seroma or abscess (series
2, image 49). No significant surrounding inflammatory changes.

Musculoskeletal: No acute osseous abnormality. No worrisome lytic or
blastic osseous lesions.
IMPRESSION: 1. Mild diffuse skin thickening involving the mid anterior abdomen,
nonspecific, but could reflect sequelae of mild/early
infection/cellulitis. Vague 2.1 cm hypodense collection at the level
of the umbilicus could reflect a small seroma and/or early abscess.
Correlation with physical exam recommended.
2. No other acute abnormality within the abdomen and pelvis. No
findings to suggest acute intra-abdominal infection.

## 2019-03-13 ENCOUNTER — Emergency Department
Admission: EM | Admit: 2019-03-13 | Discharge: 2019-03-13 | Disposition: A | Discharge status: Home / Self Care | Payer: Self-pay | Attending: Emergency Medicine | Admitting: Emergency Medicine

## 2019-03-13 ENCOUNTER — Encounter: Payer: Self-pay | Admitting: Emergency Medicine

## 2019-03-13 ENCOUNTER — Other Ambulatory Visit: Payer: Self-pay

## 2019-03-13 DIAGNOSIS — L03316 Cellulitis of umbilicus: Secondary | ICD-10-CM | POA: Insufficient documentation

## 2019-03-13 DIAGNOSIS — F17210 Nicotine dependence, cigarettes, uncomplicated: Secondary | ICD-10-CM | POA: Insufficient documentation

## 2019-03-13 MED ORDER — CLINDAMYCIN HCL 150 MG PO CAPS
300.0000 mg | ORAL_CAPSULE | Freq: Once | ORAL | Status: AC
  Administered 2019-03-13: 300 mg via ORAL
  Filled 2019-03-13: qty 2

## 2019-03-13 MED ORDER — CLINDAMYCIN HCL 300 MG PO CAPS: 300.0000 mg | ORAL_CAPSULE | Freq: Three times a day (TID) | ORAL | 0 refills | Status: AC

## 2019-03-13 NOTE — ED Notes (Signed)
See triage note  Presents with redness and some swelling to [employer] area  States he has had a cyst in same area in past

## 2019-03-13 NOTE — ED Triage Notes (Signed)
PT c/o naval pain. States she has been seen before for pain and was told she had cyst and was supposed to follow up but hasn't. States she has some drainage. Denies any fever. Redness noted

## 2019-03-13 NOTE — ED Provider Notes (Signed)
[facility] Emergency Department Provider Note ____________________________________________  Time seen: 1739  I have reviewed the triage vital signs and the nursing notes.  HISTORY  Chief Complaint  Cellulitis  HPI Colleen Nicholson is a 23 y.o. female presents herself to the ED for evaluation of suspected umbilicus cellulitis.  Patient had a similar episode about a year prior, and was seen by general surgery for a local I&D procedure.  She presents now noting several days of purulent drainage from the navel.  She denies any fevers, chills, sweats in the interim.  She has been cleaning the area regularly with hydrogen peroxide.  She presents now for evaluation of suspected infection to the navel.  Past Medical History:  Diagnosis Date  . Amenorrhea   . History of gestational hypertension 02/11/2016   Baseline P:C ratio 59  . History of placenta abruption 02/11/2016  . History of preterm delivery, currently pregnant in second trimester 02/21/2016  . Obesity   . VBAC (vaginal birth after Cesarean)     Patient Active Problem List   Diagnosis Date Noted  . BMI 38.0-38.9,adult 07/21/2015  . Smoker 07/21/2015    Past Surgical History:  Procedure Laterality Date  . CESAREAN SECTION      Prior to Admission medications   Medication Sig Start Date End Date Taking? Authorizing Provider  clindamycin (CLEOCIN) 300 MG capsule Take 1 capsule (300 mg total) by mouth 3 (three) times daily for 10 days. 03/13/19 03/23/19  Rahshawn Remo, Charlesetta IvoryJenise V Bacon, PA-C  Etonogestrel (NEXPLANON [place]) Inject into the skin. Inserted 03/10/2017    [provider]    Allergies Patient has no known allergies.  Family History  Problem Relation Age of Onset  . Asthma Mother     Social History Social History   Tobacco Use  . Smoking status: Current Every Day Smoker    Packs/day: 0.50    Types: Cigarettes  . Smokeless tobacco: Never Used  Substance Use Topics  . Alcohol use: No     Alcohol/week: 0.0 standard drinks  . Drug use: No    Review of Systems  Constitutional: Negative for fever. Eyes: Negative for visual changes. ENT: Negative for sore throat. Cardiovascular: Negative for chest pain. Respiratory: Negative for shortness of breath. Gastrointestinal: Negative for abdominal pain, vomiting and diarrhea. Genitourinary: Negative for dysuria. Musculoskeletal: Negative for back pain. Skin: Negative for rash.  Umbilical drainage as above. Neurological: Negative for headaches, focal weakness or numbness. ____________________________________________  PHYSICAL EXAM:  VITAL SIGNS: ED Triage Vitals  Enc Vitals Group     BP 03/13/19 1630 133/70     Pulse Rate 03/13/19 1628 79     Resp 03/13/19 1628 16     Temp 03/13/19 1628 98.6 F (37 C)     Temp Source 03/13/19 1628 Oral     SpO2 03/13/19 1628 100 %     Weight 03/13/19 1628 235 lb (106.6 kg)     Height 03/13/19 1628 5\' 5"  (1.651 m)     Head Circumference --      Peak Flow --      Pain Score 03/13/19 1628 6     Pain Loc --      Pain Edu? --      Excl. in GC? --     Constitutional: Alert and oriented. Well appearing and in no distress. Head: Normocephalic and atraumatic. Eyes: Conjunctivae are normal. Normal extraocular movements Cardiovascular: Normal rate, regular rhythm. Normal distal pulses. Respiratory: Normal respiratory effort. No wheezes/rales/rhonchi. Gastrointestinal:  Soft and nontender. No distention.  Patient with some mild irritation noted to the inferior border of the umbilicus.  She is noted to have a mild purulent, malodorous drainage deep in the umbilicus.  No pointing, fluctuant lesion or abscess formation is appreciated. Musculoskeletal: Nontender with normal range of motion in all extremities.  Neurologic:  Normal gait without ataxia. Normal speech and language. No gross focal neurologic deficits are appreciated. Skin:  Skin is warm, dry and intact. No rash  noted. ____________________________________________  PROCEDURES  Procedures Clindamycin 600 mg PO ____________________________________________  INITIAL IMPRESSION / ASSESSMENT AND PLAN / ED COURSE  [name] was evaluated in Emergency Department on 03/13/2019 for the symptoms described in the history of present illness. She was evaluated in the context of the global COVID-19 pandemic, which necessitated consideration that the patient might be at risk for infection with the SARS-CoV-2 virus that causes COVID-19. Institutional protocols and algorithms that pertain to the evaluation of patients at risk for COVID-19 are in a state of rapid change based on information released by regulatory bodies including the CDC and federal and state organizations. These policies and algorithms were followed during the patient's care in the ED.  Patient with ED evaluation of purulent drainage from the umbilicus.  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  Should be treated empirically with clindamycin at this time.  She is again referred to general surgery or may return to the ED for any development of a drainable abscess.  Patient verbalized understanding is discharged at this time with prescriptions and return precautions. ____________________________________________  FINAL CLINICAL IMPRESSION(S) / ED DIAGNOSES  Final diagnoses:  Cellulitis, umbilical      Carmie End, Dannielle Karvonen, PA-C 03/13/19 1912    Harvest Dark, MD 03/13/19 2034

## 2019-03-13 NOTE — Discharge Instructions (Signed)
Keep the area clean, dry, and covered when showering. Use only mild soap & water or vinegar to cleanse the area. Take the antibiotic as directed. Follow-up with Dr. Lysle Pearl or return to the ED if needed.

## 2020-03-09 ENCOUNTER — Encounter: Payer: Self-pay | Admitting: Obstetrics and Gynecology

## 2020-03-09 ENCOUNTER — Ambulatory Visit (INDEPENDENT_AMBULATORY_CARE_PROVIDER_SITE_OTHER): Payer: 59 | Admitting: Obstetrics and Gynecology

## 2020-03-09 ENCOUNTER — Other Ambulatory Visit: Payer: Self-pay

## 2020-03-09 VITALS — BP 141/89 | HR 103 | Wt 255.8 lb

## 2020-03-09 DIAGNOSIS — Z3046 Encounter for surveillance of implantable subdermal contraceptive: Secondary | ICD-10-CM

## 2020-03-09 DIAGNOSIS — Z30017 Encounter for initial prescription of implantable subdermal contraceptive: Secondary | ICD-10-CM

## 2020-03-09 HISTORY — PX: REMOVAL OF IMPLANON ROD: OBO 1006

## 2020-03-09 HISTORY — PX: INSERTION OF IMPLANON ROD: OBO 1005

## 2020-03-09 MED ORDER — ETONOGESTREL 68 MG ~~LOC~~ IMPL
68.0000 mg | DRUG_IMPLANT | Freq: Once | SUBCUTANEOUS | Status: AC
  Administered 2020-03-09: 14:00:00 68 mg via SUBCUTANEOUS

## 2020-03-09 NOTE — Addendum Note (Signed)
Addended by: Cheree Ditto, Gaius Ishaq A on: 03/09/2020 05:28 PM   Modules accepted: Orders

## 2020-03-09 NOTE — Procedures (Signed)
Nexplanon Removal and Insertion Procedure Note Nexplanon placed 03/10/2017 and no problems or issues with it. She is amenorrheic  Prior to the procedure being performed, the patient (or guardian) was asked to state their full name, date of birth, type of procedure being performed and the exact location of the operative site. This information was then checked against the documentation in the patient's chart. Prior to the procedure being performed, a "time out" was performed by the physician that confirmed the correct patient, procedure and site.  After informed consent was obtained, the patient's left arm was examined and the Nexplanon rod was noted to be easily palpable. The area was cleaned with alcohol then local anesthesia was infiltrated with 3 ml of 1% lidocaine with epinephrine. The area was prepped with betadine. Using sterile technique, the Nexplanon device was brought to the incision site. The capsule was scrapped off with the scalpel, the Nexplanon grasped with hemostats, and easily removed; the Nexplanon was inspected and noted to be intact. The removal site was hemostatic. The area was re-swabbed with betadine and injected with another 68mL of lidocaine with epi.  Using sterile technique the Nexplanon device was inserted per manufacturer's guidelines in the subdermal connective tissue using the standard insertion technique without difficulty. Pressure was applied and the insertion site was hemostatic. The presence of the Nexplanon was confirmed immediately after insertion by palpation by both me and the patient and by checking the tip of needle for the absence of the insert.  A pressure dressing was applied.  The patient tolerated the procedure well.  Patient due for another pap smear. She would like to come back for that. RTC 2 months for annual exam.   Cornelia Copa MD Attending Center for [facility])

## 2020-03-09 NOTE — Progress Notes (Signed)
Last pap 09/29/2016

## 2020-05-11 ENCOUNTER — Encounter: Payer: Self-pay | Admitting: Obstetrics and Gynecology

## 2020-05-11 ENCOUNTER — Other Ambulatory Visit (HOSPITAL_COMMUNITY)
Admission: RE | Admit: 2020-05-11 | Discharge: 2020-05-11 | Disposition: A | Discharge status: Home / Self Care | Payer: 59 | Source: Ambulatory Visit | Attending: Obstetrics and Gynecology | Admitting: Obstetrics and Gynecology

## 2020-05-11 ENCOUNTER — Ambulatory Visit (INDEPENDENT_AMBULATORY_CARE_PROVIDER_SITE_OTHER): Payer: 59 | Admitting: Obstetrics and Gynecology

## 2020-05-11 ENCOUNTER — Other Ambulatory Visit: Payer: Self-pay

## 2020-05-11 VITALS — BP 129/83 | HR 90 | Ht 65.0 in | Wt 251.0 lb

## 2020-05-11 DIAGNOSIS — N898 Other specified noninflammatory disorders of vagina: Secondary | ICD-10-CM | POA: Diagnosis not present

## 2020-05-11 DIAGNOSIS — B3731 Acute candidiasis of vulva and vagina: Secondary | ICD-10-CM

## 2020-05-11 DIAGNOSIS — Z01419 Encounter for gynecological examination (general) (routine) without abnormal findings: Secondary | ICD-10-CM

## 2020-05-11 DIAGNOSIS — B373 Candidiasis of vulva and vagina: Secondary | ICD-10-CM | POA: Diagnosis not present

## 2020-05-11 DIAGNOSIS — Z720 Tobacco use: Secondary | ICD-10-CM | POA: Diagnosis not present

## 2020-05-11 LAB — CBC
Hematocrit: 43.3 % (ref 34.0–46.6)
Hemoglobin: 15 g/dL (ref 11.1–15.9)
MCH: 31.1 pg (ref 26.6–33.0)
MCHC: 34.6 g/dL (ref 31.5–35.7)
MCV: 90 fL (ref 79–97)
Platelets: 351 10*3/uL (ref 150–450)
RBC: 4.83 x10E6/uL (ref 3.77–5.28)
RDW: 11.7 % (ref 11.7–15.4)
WBC: 9.5 10*3/uL (ref 3.4–10.8)

## 2020-05-11 LAB — HEMOGLOBIN A1C
Est. average glucose Bld gHb Est-mCnc: 134 mg/dL
Hgb A1c MFr Bld: 6.3 % — ABNORMAL HIGH (ref 4.8–5.6)

## 2020-05-11 MED ORDER — METRONIDAZOLE 500 MG PO TABS: 500.0000 mg | ORAL_TABLET | Freq: Two times a day (BID) | ORAL | 0 refills | Status: AC

## 2020-05-11 MED ORDER — FLUCONAZOLE 150 MG PO TABS: 150.0000 mg | ORAL_TABLET | Freq: Once | ORAL | 3 refills | Status: AC

## 2020-05-11 NOTE — Progress Notes (Signed)
Obstetrics and Gynecology Annual Patient Evaluation  Appointment Date: 05/11/2020  OBGYN Clinic: [facility] for [facility]  Primary Care Provider: Patient, No Pcp Per  Referring Provider: No ref. provider found  Chief Complaint:  Chief Complaint  Patient presents with  . Gynecologic Exam    History of Present Illness: Colleen Nicholson is a 24 y.o. Caucasian [id number] (No LMP recorded. Patient has had an implant.), seen for the above chief complaint. Her past medical history is significant for BMI 40s   +vaginal discharge with no itching or irritation.   Review of Systems: Pertinent items noted in HPI and remainder of comprehensive ROS otherwise negative.    Past Medical History:  Past Medical History:  Diagnosis Date  . Amenorrhea   . History of gestational hypertension 02/11/2016   Baseline P:C ratio 59  . History of placenta abruption 02/11/2016  . History of preterm delivery, currently pregnant in second trimester 02/21/2016  . Obesity   . VBAC (vaginal birth after Cesarean)     Past Surgical History:  Past Surgical History:  Procedure Laterality Date  . CESAREAN SECTION    . INSERTION OF IMPLANON ROD  03/09/2020      . REMOVAL OF IMPLANON ROD  03/09/2020        Past Obstetrical History:  OB History  Gravida Para Term Preterm AB Living  2 2 1 1   2   SAB TAB Ectopic Multiple Live Births        0 2    # Outcome Date GA Lbr Len/2nd Weight Sex Delivery Anes PTL Lv  2 Term 08/16/16 [redacted]w[redacted]d 06:15 / 02:15 7 lb 8.1 oz (3.405 kg) M VBAC EPI  LIV  1 Preterm 07/25/15 [redacted]w[redacted]d   F CS-Unspec   LIV   Past Gynecological History: As per HPI. History of Pap Smear(s): Yes.   Last pap 2018, which was negative She is currently using Nexplanon (placed 02/2020) for contraception.   Social History:  Social History   Socioeconomic History  . Marital status: Married    Spouse name: Not on file  . Number of children: Not on file  . Years of education: Not on file  .  Highest education level: Not on file  Occupational History  . Not on file  Tobacco Use  . Smoking status: Former Smoker    Packs/day: 0.50    Types: Cigarettes    Quit date: 10/17/2019    Years since quitting: 0.5  . Smokeless tobacco: Never Used  Vaping Use  . Vaping Use: Never used  Substance and Sexual Activity  . Alcohol use: No    Alcohol/week: 0.0 standard drinks  . Drug use: No  . Sexual activity: Yes    Birth control/protection: Implant  Other Topics Concern  . Not on file  Social History Narrative  . Not on file   Social Determinants of Health   Financial Resource Strain:   . Difficulty of Paying Living Expenses: Not on file  Food Insecurity:   . Worried About 12/17/2019 in the Last Year: Not on file  . Ran Out of Food in the Last Year: Not on file  Transportation Needs:   . Lack of Transportation (Medical): Not on file  . Lack of Transportation (Non-Medical): Not on file  Physical Activity:   . Days of Exercise per Week: Not on file  . Minutes of Exercise per Session: Not on file  Stress:   . Feeling of Stress : Not on file  Social Connections:   . Frequency of Communication with Friends and Family: Not on file  . Frequency of Social Gatherings with Friends and Family: Not on file  . Attends Religious Services: Not on file  . Active Member of Clubs or Organizations: Not on file  . Attends Banker Meetings: Not on file  . Marital Status: Not on file  Intimate Partner Violence:   . Fear of Current or Ex-Partner: Not on file  . Emotionally Abused: Not on file  . Physically Abused: Not on file  . Sexually Abused: Not on file    Family History:  Family History  Problem Relation Age of Onset  . Asthma Mother     Medications [name] had no medications administered during this visit. Current Outpatient Medications  Medication Sig Dispense Refill  . Etonogestrel (NEXPLANON [place]) Inject into the skin. 03/09/20 03/10/17     No  current facility-administered medications for this visit.    Allergies Patient has no known allergies.   Physical Exam:  BP 129/83   Pulse 90   Ht 5\' 5"  (1.651 m)   Wt 251 lb (113.9 kg)   BMI 41.77 kg/m  Body mass index is 41.77 kg/m. General appearance: Well nourished, well developed female in no acute distress.  Neck:  Supple, normal appearance, and no thyromegaly  Cardiovascular: normal s1 and s2.  No murmurs, rubs or gallops. Respiratory:  Clear to auscultation bilateral. Normal respiratory effort Abdomen: positive bowel sounds and no masses, hernias; diffusely non tender to palpation, non distended Breasts: breasts appear normal, no suspicious masses, no skin or nipple changes or axillary nodes, normal palpation. Neuro/Psych:  Normal mood and affect.  Skin:  Warm and dry.  Lymphatic:  No inguinal lymphadenopathy.   Pelvic exam: is not limited by body habitus EGBUS: within normal limits Vagina: within normal limits and with no blood in the vault. +cottage white cheese d/c in the vualt Cervix: normal appearing cervix without tenderness, discharge or lesions. Uterus:  nonenlarged and non tender Adnexa:  normal adnexa and no mass, fullness, tenderness Rectovaginal: deferred  Laboratory: none  Radiology: none  Assessment: pt doing well  Plan:  1. Vaginal discharge Flagyl and diflucan sent in. Pt declines STI testing  2. Well woman exam Basic labs today and PCP resources given. - Cytology - PAP - Hemoglobin A1c - Comprehensive metabolic panel - CBC - TSH - Lipid panel - Urinalysis, Routine w reflex microscopic  3. Vulvovaginal candidiasis  Orders Placed This Encounter  Procedures  . Hemoglobin A1c  . Comprehensive metabolic panel  . CBC  . TSH  . Lipid panel  . Urinalysis, Routine w reflex microscopic    RTC 1 year and PRN  MD Attending Center for [facility] Copa [facility])

## 2020-05-12 ENCOUNTER — Encounter: Payer: Self-pay | Admitting: Obstetrics and Gynecology

## 2020-05-12 DIAGNOSIS — R7401 Elevation of levels of liver transaminase levels: Secondary | ICD-10-CM | POA: Insufficient documentation

## 2020-05-12 DIAGNOSIS — E785 Hyperlipidemia, unspecified: Secondary | ICD-10-CM | POA: Insufficient documentation

## 2020-05-12 DIAGNOSIS — R7303 Prediabetes: Secondary | ICD-10-CM | POA: Insufficient documentation

## 2020-05-12 DIAGNOSIS — E781 Pure hyperglyceridemia: Secondary | ICD-10-CM | POA: Insufficient documentation

## 2020-05-12 LAB — MICROSCOPIC EXAMINATION
Casts: NONE SEEN /lpf
RBC, Urine: NONE SEEN /hpf (ref 0–2)

## 2020-05-12 LAB — URINALYSIS, ROUTINE W REFLEX MICROSCOPIC
Bilirubin, UA: NEGATIVE
Ketones, UA: NEGATIVE
Nitrite, UA: NEGATIVE
RBC, UA: NEGATIVE
Specific Gravity, UA: >=1.03 — AB (ref 1.005–1.030)
Urobilinogen, Ur: 0.2 mg/dL (ref 0.2–1.0)
pH, UA: 7 (ref 5.0–7.5)

## 2020-05-12 LAB — COMPREHENSIVE METABOLIC PANEL
ALT: 38 IU/L — ABNORMAL HIGH (ref 0–32)
AST: 19 IU/L (ref 0–40)
Albumin/Globulin Ratio: 1.8 (ref 1.2–2.2)
Albumin: 4.5 g/dL (ref 3.9–5.0)
Alkaline Phosphatase: 113 IU/L (ref 44–121)
BUN/Creatinine Ratio: 11 (ref 9–23)
BUN: 8 mg/dL (ref 6–20)
Bilirubin Total: 0.4 mg/dL (ref 0.0–1.2)
CO2: 24 mmol/L (ref 20–29)
Calcium: 9.5 mg/dL (ref 8.7–10.2)
Chloride: 103 mmol/L (ref 96–106)
Creatinine, Ser: 0.76 mg/dL (ref 0.57–1.00)
GFR calc Af Amer: 126 mL/min/{1.73_m2} (ref >59)
GFR calc non Af Amer: 109 mL/min/{1.73_m2} (ref >59)
Globulin, Total: 2.5 g/dL (ref 1.5–4.5)
Glucose: 174 mg/dL — ABNORMAL HIGH (ref 65–99)
Potassium: 4.1 mmol/L (ref 3.5–5.2)
Sodium: 139 mmol/L (ref 134–144)
Total Protein: 7 g/dL (ref 6.0–8.5)

## 2020-05-12 LAB — LIPID PANEL
Chol/HDL Ratio: 6.3 ratio — ABNORMAL HIGH (ref 0.0–4.4)
Cholesterol, Total: 183 mg/dL (ref 100–199)
HDL: 29 mg/dL — ABNORMAL LOW (ref >39)
LDL Chol Calc (NIH): 107 mg/dL — ABNORMAL HIGH (ref 0–99)
Triglycerides: 272 mg/dL — ABNORMAL HIGH (ref 0–149)
VLDL Cholesterol Cal: 47 mg/dL — ABNORMAL HIGH (ref 5–40)

## 2020-05-12 LAB — TSH: TSH: 0.825 u[IU]/mL (ref 0.450–4.500)

## 2020-05-13 LAB — CYTOLOGY - PAP: Diagnosis: NEGATIVE

## 2020-05-20 ENCOUNTER — Telehealth: Payer: Self-pay

## 2020-05-20 NOTE — Telephone Encounter (Signed)
-----   Message from Charlie Pickens, MD sent at 05/20/2020 10:02 AM EDT ----- °Can you let her know that she has pre-diabetes, high cholesterol, and likely fatty liver and needs to see a primary care provider to follow these; I recommend she be seen sometime in the next three months by a PCP.  ° °Ask her if she needs help setting up an appointment with someone, too. Thanks °

## 2020-05-20 NOTE — Telephone Encounter (Signed)
-----   Message from Buckner Bing, MD sent at 05/20/2020 10:02 AM EDT ----- Can you let her know that she has pre-diabetes, high cholesterol, and likely fatty liver and needs to see a primary care provider to follow these; I recommend she be seen sometime in the next three months by a PCP.   Ask her if she needs help setting up an appointment with someone, too. Thanks

## 2020-05-20 NOTE — Telephone Encounter (Signed)
Pt returned missed phone call from myself. Pt informed of lab results and advised to schedule an appt with a PCP. Pt voiced understanding.

## 2020-07-22 ENCOUNTER — Emergency Department: Payer: HRSA Program

## 2020-07-22 ENCOUNTER — Other Ambulatory Visit: Payer: Self-pay

## 2020-07-22 ENCOUNTER — Emergency Department
Admission: EM | Admit: 2020-07-22 | Discharge: 2020-07-22 | Disposition: A | Discharge status: Home / Self Care | Payer: HRSA Program | Attending: Emergency Medicine | Admitting: Emergency Medicine

## 2020-07-22 DIAGNOSIS — R079 Chest pain, unspecified: Secondary | ICD-10-CM

## 2020-07-22 DIAGNOSIS — Z87891 Personal history of nicotine dependence: Secondary | ICD-10-CM | POA: Diagnosis not present

## 2020-07-22 DIAGNOSIS — U071 COVID-19: Secondary | ICD-10-CM | POA: Insufficient documentation

## 2020-07-22 LAB — CBC
HCT: 44.5 % (ref 36.0–46.0)
Hemoglobin: 14.9 g/dL (ref 12.0–15.0)
MCH: 30.5 pg (ref 26.0–34.0)
MCHC: 33.5 g/dL (ref 30.0–36.0)
MCV: 91.2 fL (ref 80.0–100.0)
Platelets: 289 10*3/uL (ref 150–400)
RBC: 4.88 MIL/uL (ref 3.87–5.11)
RDW: 12.1 % (ref 11.5–15.5)
WBC: 7.4 10*3/uL (ref 4.0–10.5)
nRBC: 0 % (ref 0.0–0.2)

## 2020-07-22 LAB — BASIC METABOLIC PANEL
Anion gap: 10 (ref 5–15)
BUN: 7 mg/dL (ref 6–20)
CO2: 25 mmol/L (ref 22–32)
Calcium: 9.1 mg/dL (ref 8.9–10.3)
Chloride: 103 mmol/L (ref 98–111)
Creatinine, Ser: 0.61 mg/dL (ref 0.44–1.00)
GFR, Estimated: >60 mL/min (ref >60)
Glucose, Bld: 126 mg/dL — ABNORMAL HIGH (ref 70–99)
Potassium: 3.7 mmol/L (ref 3.5–5.1)
Sodium: 138 mmol/L (ref 135–145)

## 2020-07-22 LAB — TROPONIN I (HIGH SENSITIVITY): Troponin I (High Sensitivity): 2 ng/L (ref <18)

## 2020-07-22 MED ORDER — ACETAMINOPHEN 500 MG PO TABS
1000.0000 mg | ORAL_TABLET | Freq: Once | ORAL | Status: AC
  Administered 2020-07-22: 1000 mg via ORAL
  Filled 2020-07-22: qty 2

## 2020-07-22 MED ORDER — ONDANSETRON HCL 4 MG PO TABS: 4.0000 mg | ORAL_TABLET | Freq: Three times a day (TID) | ORAL | 0 refills | Status: DC | PRN

## 2020-07-22 NOTE — Discharge Instructions (Signed)
Please seek medical attention for any high fevers, chest pain, shortness of breath, change in behavior, persistent vomiting, bloody stool or any other new or concerning symptoms.  

## 2020-07-22 NOTE — ED Provider Notes (Signed)
[facility] Emergency Department Provider Note   ____________________________________________   I have reviewed the triage vital signs and the nursing notes.   HISTORY  Chief Complaint Chest Pain   History limited by: Not Limited   HPI Colleen Nicholson is a 25 y.o. female who presents to the emergency department today because of concern for chest pain. She states the pain is located on the right side of her chest. The pain started roughly 1 week ago. It has been accompanied by pain to her back and some headache. The pain is worse with movement and pressure. She has found it hard to sleep at night because of the pain. The patient denies any trauma or unusual activity prior to the pain. Denies any recent travel. Denies any lweg swelling.   Records reviewed. Per medical record review patient has a history of HLD  Past Medical History:  Diagnosis Date  . Amenorrhea   . History of gestational hypertension 02/11/2016   Baseline P:C ratio 59  . History of placenta abruption 02/11/2016  . History of preterm delivery, currently pregnant in second trimester 02/21/2016  . Obesity   . VBAC (vaginal birth after Cesarean)     Patient Active Problem List   Diagnosis Date Noted  . Pre-diabetes 05/12/2020  . Hyperlipidemia 05/12/2020  . ?Hypertriglyceridemia 05/12/2020  . Elevated ALT measurement 05/12/2020  . BMI 38.0-38.9,adult 07/21/2015  . Vapes nicotine containing substance 07/21/2015    Past Surgical History:  Procedure Laterality Date  . CESAREAN SECTION    . INSERTION OF IMPLANON ROD  03/09/2020      . REMOVAL OF IMPLANON ROD  03/09/2020        Prior to Admission medications   Medication Sig Start Date End Date Taking? Authorizing Provider  Etonogestrel (NEXPLANON [place]) Inject into the skin. 03/09/20 03/10/17    [provider]    Allergies Patient has no known allergies.  Family History  Problem Relation Age of Onset  . Asthma Mother      Social History Social History   Tobacco Use  . Smoking status: Former Smoker    Packs/day: 0.50    Types: Cigarettes    Quit date: 10/17/2019    Years since quitting: 0.7  . Smokeless tobacco: Never Used  Vaping Use  . Vaping Use: Never used  Substance Use Topics  . Alcohol use: No    Alcohol/week: 0.0 standard drinks  . Drug use: No    Review of Systems Constitutional: No fever/chills Eyes: No visual changes. ENT: No sore throat. Cardiovascular: Positive for chest pain. Respiratory: Denies shortness of breath. Gastrointestinal: No abdominal pain.  No nausea, no vomiting.  No diarrhea.   Genitourinary: Negative for dysuria. Musculoskeletal: Positive for back pain.  Skin: Negative for rash. Neurological: Positive for headache.   ____________________________________________   PHYSICAL EXAM:  VITAL SIGNS: ED Triage Vitals  Enc Vitals Group     BP 07/22/20 1518 135/86     Pulse Rate 07/22/20 1518 (!) 116     Resp 07/22/20 1518 18     Temp 07/22/20 1518 99.6 F (37.6 C)     Temp Source 07/22/20 1518 Oral     SpO2 07/22/20 1518 100 %     Weight 07/22/20 1517 250 lb (113.4 kg)     Height 07/22/20 1517 5\' 5"  (1.651 m)     Head Circumference --      Peak Flow --      Pain Score 07/22/20 1517 7  Pain Loc --      Pain Edu? --      Excl. in GC? --      Constitutional: Alert and oriented.  Eyes: Conjunctivae are normal.  ENT      Head: Normocephalic and atraumatic.      Nose: No congestion/rhinnorhea.      Mouth/Throat: Mucous membranes are moist.      Neck: No stridor. Hematological/Lymphatic/Immunilogical: No cervical lymphadenopathy. Cardiovascular: Normal rate, regular rhythm.  No murmurs, rubs, or gallops.  Respiratory: Normal respiratory effort without tachypnea nor retractions. Breath sounds are clear and equal bilaterally. No wheezes/rales/rhonchi. Gastrointestinal: Soft and non tender. No rebound. No guarding.  Genitourinary:  Deferred Musculoskeletal: Tender to palpation over the right upper chest wall. Neurologic:  Normal speech and language. No gross focal neurologic deficits are appreciated.  Skin:  Skin is warm, dry and intact. No rash noted. Psychiatric: Mood and affect are normal. Speech and behavior are normal. Patient exhibits appropriate insight and judgment.  ____________________________________________    LABS (pertinent positives/negatives)  Trop hs 2 CBC wbc 7.4, hgb 14.9, plt 289 BMP wnl except glu 126  ____________________________________________   EKG  I, Phineas Semen, attending physician, personally viewed and interpreted this EKG  EKG Time: 1513 Rate: 117 Rhythm: sinus tachycardia Axis: normal Intervals: qtc 418 QRS: narrow ST changes: no st elevation Impression: abnormal ekg  ____________________________________________    RADIOLOGY  CXR No acute cardiopulmonary disease  ____________________________________________   PROCEDURES  Procedures  ____________________________________________   INITIAL IMPRESSION / ASSESSMENT AND PLAN / ED COURSE  Pertinent labs & imaging results that were available during my care of the patient were reviewed by me and considered in my medical decision making (see chart for details).  Patient presented to the emergency department because of concern for chest and back pain and headache. Work up here without concerning leukocytosis or elevated troponin. CXR without concerning findings. Doubt PE. Do wonder if patient has covid or other viral illness, especially given the tenderness to palpation of the right upper chest. Discussed this with the patient. Will send test.  ____________________________________________   FINAL CLINICAL IMPRESSION(S) / ED DIAGNOSES  Final diagnoses:  Chest pain, unspecified type     Note: This dictation was prepared with Dragon dictation. Any transcriptional errors that result from this process are  unintentional     Phineas Semen, MD 07/22/20 1945

## 2020-07-22 NOTE — ED Triage Notes (Signed)
Pt here with CP that started for a few days but got worse yesterday and today. CP radiates to bilateral sides and back, pt has chronic back pain as well. Pt NAD in triage.

## 2020-07-23 LAB — SARS CORONAVIRUS 2 (TAT 6-24 HRS): SARS Coronavirus 2: POSITIVE — AB

## 2021-04-06 ENCOUNTER — Encounter: Payer: Self-pay | Admitting: Radiology

## 2022-01-21 ENCOUNTER — Ambulatory Visit
Admission: RE | Admit: 2022-01-21 | Discharge: 2022-01-21 | Disposition: A | Discharge status: Home / Self Care | Payer: 59 | Source: Ambulatory Visit | Attending: Urgent Care | Admitting: Urgent Care

## 2022-01-21 VITALS — BP 114/78 | HR 80 | Temp 97.9°F | Resp 18

## 2022-01-21 DIAGNOSIS — N760 Acute vaginitis: Secondary | ICD-10-CM | POA: Insufficient documentation

## 2022-01-21 DIAGNOSIS — B3731 Acute candidiasis of vulva and vagina: Secondary | ICD-10-CM | POA: Diagnosis not present

## 2022-01-21 DIAGNOSIS — N898 Other specified noninflammatory disorders of vagina: Secondary | ICD-10-CM | POA: Diagnosis present

## 2022-01-21 DIAGNOSIS — R350 Frequency of micturition: Secondary | ICD-10-CM | POA: Diagnosis present

## 2022-01-21 LAB — POCT URINALYSIS DIP (MANUAL ENTRY)
Glucose, UA: NEGATIVE mg/dL
Ketones, POC UA: NEGATIVE mg/dL
Nitrite, UA: NEGATIVE
Protein Ur, POC: 30 mg/dL — AB
Spec Grav, UA: >=1.03 — AB (ref 1.010–1.025)
Urobilinogen, UA: 1 E.U./dL
pH, UA: 6 (ref 5.0–8.0)

## 2022-01-21 LAB — POCT URINE PREGNANCY: Preg Test, Ur: NEGATIVE

## 2022-01-21 MED ORDER — FLUCONAZOLE 150 MG PO TABS: 150.0000 mg | ORAL_TABLET | Freq: Once | ORAL | 0 refills | Status: AC

## 2022-01-21 NOTE — ED Triage Notes (Signed)
Pt presents with vaginal itching, discharge and urinary frequency started yesterday.

## 2022-01-21 NOTE — ED Provider Notes (Signed)
Colleen Nicholson    CSN: [id number] Arrival date & time: 01/21/22  1439      History   Chief Complaint Chief Complaint  Patient presents with   Vaginal Itching    Im not sure if i have a uti or a yeast infection. It itches and sometimes feels like i have to pee but not much comes out - Entered by patient   Urinary Frequency   Vaginal Discharge    HPI [name] is a 26 y.o. female.   Pt presents with vaginal itching, irritation, and urinary frequency for the past 2-3 days. Denies dysuria, pelvic pain or vaginal discharge. Reports a deep internal itch. Also notes that she has a nexplanon in place, but had random spotting last week. Denies STI exposure, has not tried any OTC meds. No flank pain, fever, or rash.    Vaginal Itching  Urinary Frequency  Vaginal Discharge Associated symptoms: vaginal itching     Past Medical History:  Diagnosis Date   Amenorrhea    History of gestational hypertension 02/11/2016   Baseline P:C ratio 59   History of placenta abruption 02/11/2016   History of preterm delivery, currently pregnant in second trimester 02/21/2016   Obesity    VBAC (vaginal birth after Cesarean)     Patient Active Problem List   Diagnosis Date Noted   Pre-diabetes 05/12/2020   Hyperlipidemia 05/12/2020   ?Hypertriglyceridemia 05/12/2020   Elevated ALT measurement 05/12/2020   BMI 38.0-38.9,adult 07/21/2015   Vapes nicotine containing substance 07/21/2015    Past Surgical History:  Procedure Laterality Date   CESAREAN SECTION     INSERTION OF IMPLANON ROD  03/09/2020       REMOVAL OF IMPLANON ROD  03/09/2020        OB History     Gravida  2   Para  2   Term  1   Preterm  1   AB      Living  2      SAB      IAB      Ectopic      Multiple  0   Live Births  2            Home Medications    Prior to Admission medications   Medication Sig Start Date End Date Taking? Authorizing Provider  fluconazole (DIFLUCAN) 150 MG  tablet Take 1 tablet (150 mg total) by mouth once for 1 dose. 01/21/22 01/21/22 Yes Mahasin Riviere L, PA  Etonogestrel (NEXPLANON ) Inject into the skin. 03/09/20 03/10/17    [provider]  ondansetron (ZOFRAN) 4 MG tablet Take 1 tablet (4 mg total) by mouth every 8 (eight) hours as needed. 07/22/20   Phineas Semen, MD    Family History Family History  Problem Relation Age of Onset   Asthma Mother     Social History Social History   Tobacco Use   Smoking status: Former    Packs/day: 0.50    Types: Cigarettes    Quit date: 10/17/2019    Years since quitting: 2.2   Smokeless tobacco: Never  Vaping Use   Vaping Use: Never used  Substance Use Topics   Alcohol use: No    Alcohol/week: 0.0 standard drinks of alcohol   Drug use: No     Allergies   Patient has no known allergies.   Review of Systems Review of Systems  Genitourinary:  Positive for frequency. Negative for vaginal discharge.  Itching  All other systems reviewed and are negative.    Physical Exam Triage Vital Signs ED Triage Vitals  Enc Vitals Group     BP 01/21/22 1457 114/78     Pulse Rate 01/21/22 1457 80     Resp 01/21/22 1457 18     Temp 01/21/22 1457 97.9 F (36.6 C)     Temp Source 01/21/22 1457 Oral     SpO2 01/21/22 1457 98 %     Weight --      Height --      Head Circumference --      Peak Flow --      Pain Score 01/21/22 1456 0     Pain Loc --      Pain Edu? --      Excl. in GC? --    No data found.  Updated Vital Signs BP 114/78 (BP Location: Left Arm)   Pulse 80   Temp 97.9 F (36.6 C) (Oral)   Resp 18   SpO2 98%   Visual Acuity Right Eye Distance:   Left Eye Distance:   Bilateral Distance:    Right Eye Near:   Left Eye Near:    Bilateral Near:     Physical Exam Vitals and nursing note reviewed. Exam conducted with a chaperone present.  Constitutional:      General: She is not in acute distress.    Appearance: Normal appearance. She is well-developed.  She is obese. She is not ill-appearing, toxic-appearing or diaphoretic.  HENT:     Head: Normocephalic and atraumatic.  Cardiovascular:     Rate and Rhythm: Normal rate.     Heart sounds: No murmur heard. Pulmonary:     Effort: Pulmonary effort is normal. No respiratory distress.     Breath sounds: No wheezing.  Abdominal:     General: Abdomen is flat. Bowel sounds are normal. There is no distension. There are no signs of injury.     Palpations: Abdomen is soft. There is no shifting dullness, fluid wave, hepatomegaly, splenomegaly, mass or pulsatile mass.     Tenderness: There is no abdominal tenderness. There is no right CVA tenderness, left CVA tenderness, guarding or rebound. Negative signs include Murphy's sign, Rovsing's sign, McBurney's sign, psoas sign and obturator sign.     Hernia: No hernia is present.  Genitourinary:    General: Normal vulva.     Pubic Area: No rash or pubic lice.      Labia:        Right: No rash, tenderness, lesion or injury.        Left: No rash, tenderness, lesion or injury.      Urethra: No prolapse, urethral pain, urethral swelling or urethral lesion.     Vagina: No signs of injury and foreign body. Vaginal discharge present. No erythema, tenderness, bleeding, lesions or prolapsed vaginal walls.     Cervix: No cervical motion tenderness, discharge, friability, lesion, erythema or eversion.     Uterus: Normal. Not deviated, not enlarged, not fixed, not tender and no uterine prolapse.      Adnexa: Right adnexa normal and left adnexa normal.       Right: No mass, tenderness or fullness.         Left: No mass, tenderness or fullness.       Rectum: Normal.     Comments: Beefy red, dry, irritated vaginal introitus with white discharge Skin:    General: Skin is warm.     Findings:  No rash.  Neurological:     General: No focal deficit present.     Mental Status: She is alert.      UC Treatments / Results  Labs (all labs ordered are listed, but only  abnormal results are displayed) Labs Reviewed  POCT URINALYSIS DIP (MANUAL ENTRY) - Abnormal; Notable for the following components:      Result Value   Clarity, UA cloudy (*)    Bilirubin, UA small (*)    Spec Grav, UA >=1.030 (*)    Blood, UA trace-lysed (*)    Protein Ur, POC =30 (*)    Leukocytes, UA Large (3+) (*)    All other components within normal limits  URINE CULTURE  POCT URINE PREGNANCY  CERVICOVAGINAL ANCILLARY ONLY    EKG   Radiology No results found.  Procedures Procedures (including critical care time)  Medications Ordered in UC Medications - No data to display  Initial Impression / Assessment and Plan / UC Course  I have reviewed the triage vital signs and the nursing notes.  Pertinent labs & imaging results that were available during my care of the patient were reviewed by me and considered in my medical decision making (see chart for details).     Candidiasis of vagina - aptima swab collected. Physical exam most consistent with candidiasis as the cause. Will call with results of aptima if additional treatment required Urinary frequency - no dysuria. Leuks could be due to contamination from vaginal discharge. Will await urine culture results, only start abx if urine culture positive  Final Clinical Impressions(s) / UC Diagnoses   Final diagnoses:  Vaginitis and vulvovaginitis  Urinary frequency     Discharge Instructions      Your symptoms are most likely secondary to a yeast infection. I have called in one tablet of diflucan for you to take. We also tested you for gonorrhea, chlamydia, BV, trichomonas. We will call with the results if additional treatment is indicated. Your urine has been cultured. RTC if any development of pelvic pain or flank pain.     ED Prescriptions     Medication Sig Dispense Auth. Provider   fluconazole (DIFLUCAN) 150 MG tablet Take 1 tablet (150 mg total) by mouth once for 1 dose. 1 tablet Deandra Goering L, [place]       PDMP not reviewed this encounter.   Chaney Malling, [place] 01/21/22 1531

## 2022-01-21 NOTE — Discharge Instructions (Addendum)
Your symptoms are most likely secondary to a yeast infection. I have called in one tablet of diflucan for you to take. We also tested you for gonorrhea, chlamydia, BV, trichomonas. We will call with the results if additional treatment is indicated. Your urine has been cultured. RTC if any development of pelvic pain or flank pain.

## 2022-01-22 LAB — URINE CULTURE: Culture: 10000 — AB

## 2022-01-24 LAB — CERVICOVAGINAL ANCILLARY ONLY
Bacterial Vaginitis (gardnerella): NEGATIVE
Candida Glabrata: NEGATIVE
Candida Vaginitis: POSITIVE — AB
Comment: NEGATIVE
Comment: NEGATIVE
Comment: NEGATIVE

## 2022-02-15 ENCOUNTER — Encounter: Payer: Self-pay | Admitting: Nurse Practitioner

## 2022-02-15 ENCOUNTER — Telehealth: Payer: Self-pay

## 2022-02-15 ENCOUNTER — Ambulatory Visit (INDEPENDENT_AMBULATORY_CARE_PROVIDER_SITE_OTHER): Payer: 59 | Admitting: Nurse Practitioner

## 2022-02-15 VITALS — BP 118/76 | HR 83 | Temp 97.7°F | Resp 12 | Ht 65.0 in | Wt 243.4 lb

## 2022-02-15 DIAGNOSIS — Z5941 Food insecurity: Secondary | ICD-10-CM

## 2022-02-15 DIAGNOSIS — R5383 Other fatigue: Secondary | ICD-10-CM

## 2022-02-15 DIAGNOSIS — R7303 Prediabetes: Secondary | ICD-10-CM | POA: Diagnosis not present

## 2022-02-15 DIAGNOSIS — Z0000 Encounter for general adult medical examination without abnormal findings: Secondary | ICD-10-CM

## 2022-02-15 LAB — COMPREHENSIVE METABOLIC PANEL
ALT: 28 U/L (ref 0–35)
AST: 18 U/L (ref 0–37)
Albumin: 4.3 g/dL (ref 3.5–5.2)
Alkaline Phosphatase: 92 U/L (ref 39–117)
BUN: 7 mg/dL (ref 6–23)
CO2: 25 mEq/L (ref 19–32)
Calcium: 8.9 mg/dL (ref 8.4–10.5)
Chloride: 105 mEq/L (ref 96–112)
Creatinine, Ser: 0.73 mg/dL (ref 0.40–1.20)
GFR: 112.72 mL/min (ref >60.00)
Glucose, Bld: 114 mg/dL — ABNORMAL HIGH (ref 70–99)
Potassium: 4 mEq/L (ref 3.5–5.1)
Sodium: 139 mEq/L (ref 135–145)
Total Bilirubin: 0.4 mg/dL (ref 0.2–1.2)
Total Protein: 6.9 g/dL (ref 6.0–8.3)

## 2022-02-15 LAB — CBC
HCT: 44 % (ref 36.0–46.0)
Hemoglobin: 14.9 g/dL (ref 12.0–15.0)
MCHC: 33.9 g/dL (ref 30.0–36.0)
MCV: 90.6 fl (ref 78.0–100.0)
Platelets: 296 10*3/uL (ref 150.0–400.0)
RBC: 4.86 Mil/uL (ref 3.87–5.11)
RDW: 12.9 % (ref 11.5–15.5)
WBC: 7.6 10*3/uL (ref 4.0–10.5)

## 2022-02-15 LAB — LIPID PANEL
Cholesterol: 169 mg/dL (ref 0–200)
HDL: 37.3 mg/dL — ABNORMAL LOW (ref >39.00)
LDL Cholesterol: 106 mg/dL — ABNORMAL HIGH (ref 0–99)
NonHDL: 132.13
Total CHOL/HDL Ratio: 5
Triglycerides: 129 mg/dL (ref 0.0–149.0)
VLDL: 25.8 mg/dL (ref 0.0–40.0)

## 2022-02-15 LAB — TSH: TSH: 0.9 u[IU]/mL (ref 0.35–5.50)

## 2022-02-15 LAB — HEMOGLOBIN A1C: Hgb A1c MFr Bld: 5.7 % (ref 4.6–6.5)

## 2022-02-15 LAB — VITAMIN B12: Vitamin B-12: 309 pg/mL (ref 211–911)

## 2022-02-15 NOTE — Progress Notes (Signed)
New Patient Office Visit  Subjective    Patient ID: Colleen Nicholson, female    DOB: [birth year]  Age: 26 y.o. MRN: [id number]  CC:  Chief Complaint  Patient presents with   Establish Care    No previous PCP since high school. Has GYN with [facility]- in the chart    HPI [name] presents to establish care  for complete physical and follow up of chronic conditions.  Immunizations: -Tetanus:2017 -Influenza: out of season -Covid-19: Refused -Shingles: too young -Pneumonia: too young  -HPV: UTD  Diet: Fair diet. States she eats when she can or feels hungry. States that she is struggling food wise. 1 Meal a day with some snacks. Water and soda (mainly) Exercise: No regular exercise. Employment, kids  Eye exam: PRN Dental exam: needs updating    Pap Smear: Completed in 04/2020 Mammogram: Too young, currently average risk  Colonoscopy: Too young, currently average risk Lung Cancer Screening: N/A Dexa: Too young, currently average risk  Sleep: States her schedule is "fudged". States that her and her spouse only have time after the kids go to sleep  Bedtime midnight or after. She will get up around 830. Does not feel rested. Not a snore     Outpatient Encounter Medications as of 02/15/2022  Medication Sig   Etonogestrel (NEXPLANON [place]) Inject into the skin. 03/09/20 03/10/17   [DISCONTINUED] ondansetron (ZOFRAN) 4 MG tablet Take 1 tablet (4 mg total) by mouth every 8 (eight) hours as needed.   No facility-administered encounter medications on file as of 02/15/2022.    Past Medical History:  Diagnosis Date   Amenorrhea    History of gestational hypertension 02/11/2016   Baseline P:C ratio 59   History of placenta abruption 02/11/2016   History of preterm delivery, currently pregnant in second trimester 02/21/2016   Obesity    VBAC (vaginal birth after Cesarean)     Past Surgical History:  Procedure Laterality Date   CESAREAN SECTION     INSERTION OF  IMPLANON ROD  03/09/2020       REMOVAL OF IMPLANON ROD  03/09/2020        Family History  Problem Relation Age of Onset   COPD Mother    Asthma Mother    Fibromyalgia Maternal Grandmother    Healthy Half-Brother     Social History   Socioeconomic History   Marital status: Married    Spouse name: Not on file   Number of children: 2   Years of education: Not on file   Highest education level: Not on file  Occupational History   Not on file  Tobacco Use   Smoking status: Former    Packs/day: 0.50    Years: 5.00    Total pack years: 2.50    Types: Cigarettes    Quit date: 10/17/2019    Years since quitting: 2.3    Passive exposure: Past   Smokeless tobacco: Never  Vaping Use   Vaping Use: Every day   Substances: Nicotine, Flavoring  Substance and Sexual Activity   Alcohol use: No    Alcohol/week: 0.0 standard drinks of alcohol   Drug use: No   Sexual activity: Yes    Birth control/protection: Implant  Other Topics Concern   Not on file  Social History Narrative   Door Dash   Social Determinants of Health   Financial Resource Strain: Not on file  Food Insecurity: Not on file  Transportation Needs: Not on file  Physical  Activity: Not on file  Stress: Not on file  Social Connections: Not on file  Intimate Partner Violence: Not on file    Review of Systems  Constitutional:  Positive for malaise/fatigue. Negative for chills and fever.  Respiratory:  Negative for shortness of breath.   Cardiovascular:  Negative for chest pain and leg swelling.  Gastrointestinal:  Negative for abdominal pain, blood in stool, diarrhea, nausea and vomiting.       Bm daily  Genitourinary:  Negative for dysuria and hematuria.  Neurological:  Negative for tingling and headaches.  Psychiatric/Behavioral:  Negative for hallucinations and suicidal ideas.         Objective    BP 118/76   Pulse 83   Temp 97.7 F (36.5 C)   Resp 12   Ht 5\' 5"  (1.651 m)   Wt 243 lb 6 oz (110.4  kg)   SpO2 98%   BMI 40.50 kg/m   Physical Exam Vitals and nursing note reviewed.  Constitutional:      Appearance: Normal appearance. She is obese.  HENT:     Right Ear: Tympanic membrane, ear canal and external ear normal.     Left Ear: Tympanic membrane, ear canal and external ear normal.     Mouth/Throat:     Mouth: Mucous membranes are moist.     Pharynx: Oropharynx is clear.  Eyes:     Extraocular Movements: Extraocular movements intact.     Pupils: Pupils are equal, round, and reactive to light.  Cardiovascular:     Rate and Rhythm: Normal rate and regular rhythm.     Pulses: Normal pulses.     Heart sounds: Normal heart sounds.  Pulmonary:     Effort: Pulmonary effort is normal.     Breath sounds: Normal breath sounds.  Abdominal:     General: Bowel sounds are normal. There is no distension.     Palpations: There is no mass.     Tenderness: There is no abdominal tenderness.     Hernia: No hernia is present.  Musculoskeletal:     Right lower leg: No edema.     Left lower leg: No edema.  Lymphadenopathy:     Cervical: No cervical adenopathy.  Skin:    General: Skin is warm.  Neurological:     General: No focal deficit present.     Mental Status: She is alert.     Deep Tendon Reflexes:     Reflex Scores:      Bicep reflexes are 2+ on the right side and 2+ on the left side.      Patellar reflexes are 2+ on the right side and 2+ on the left side.    Comments: Bilateral upper and lower extremity strength 5/5  Psychiatric:        Attention and Perception: Attention normal.        Mood and Affect: Mood is depressed.        Speech: Speech normal.        Behavior: Behavior normal.        Thought Content: Thought content normal.        Cognition and Memory: Cognition normal.         Assessment & Plan:   Problem List Items Addressed This Visit       Other   Morbid obesity (HCC)    Encouraged healthy lifestyle modifications in regards to exercise       Relevant Orders   Hemoglobin A1c   Lipid  panel   Prediabetes    Historical diagnosis.  Pending A1c today      Relevant Orders   Hemoglobin A1c   Preventative health care - Primary    Discussed age-appropriate physicians and screening exams.      Relevant Orders   CBC   Comprehensive metabolic panel   Hemoglobin A1c   Lipid panel   Other fatigue    Could be multifactorial.  Patient might have some slight depression.  Consider sleep apnea.  Pending metabolic labs today      Relevant Orders   TSH   Vitamin B12   Food insecurity    Patient expressed concerns for food insecurity.  We will have referral for community health nurse.  Patient also has a Child psychotherapist through social services that does help with food stamps and such.      Relevant Orders   AMB Referral to [facility] Coordinaton    Return in about 1 year (around 02/16/2023) for CPe and labs.   Audria Nine, NP

## 2022-02-15 NOTE — Assessment & Plan Note (Signed)
Historical diagnosis.  Pending A1c today

## 2022-02-15 NOTE — Assessment & Plan Note (Signed)
Patient expressed concerns for food insecurity.  We will have referral for community health nurse.  Patient also has a Child psychotherapist through social services that does help with food stamps and such.

## 2022-02-15 NOTE — Telephone Encounter (Signed)
   Telephone encounter was:  Successful.  02/15/2022 Name: Colleen Nicholson MRN: [id number] DOB: Feb 21, 1996  [name] is a 26 y.o. year old female who is a primary care patient of Patient, No Pcp Per . The community resource team was consulted for assistance with Food Insecurity  Care guide performed the following interventions: Spoke with patient verfied email address [email or web address]. Emailed [employer], food stamp and [employer] map. Patient has my contact information if she does not receive email.  Follow Up Plan:  No further follow up planned at this time. The patient has been provided with needed resources.  Delfin Squillace, AAS Paralegal, [facility] Care Guide  Embedded Care Coordination [facility]  Care Management  [address] [place], [place] [address] [email or web address]  ?? [phone]   [email or web address]

## 2022-02-15 NOTE — Assessment & Plan Note (Signed)
Could be multifactorial.  Patient might have some slight depression.  Consider sleep apnea.  Pending metabolic labs today

## 2022-02-15 NOTE — Assessment & Plan Note (Signed)
Discussed age-appropriate physicians and screening exams.

## 2022-02-15 NOTE — Patient Instructions (Signed)
Nice to see you today I have sent in an order for our community care nurse to reach out and help you get some information on community resources I want to see you in 1 year for you next physical, sooner if you need me I may have you come back in sooner depending on the lab results

## 2022-02-15 NOTE — Assessment & Plan Note (Signed)
Encouraged healthy lifestyle modifications in regards to exercise

## 2022-05-10 ENCOUNTER — Ambulatory Visit
Admission: RE | Admit: 2022-05-10 | Discharge: 2022-05-10 | Discharge status: Against Medical Advice | Payer: 59 | Source: Ambulatory Visit

## 2022-06-06 ENCOUNTER — Emergency Department: Payer: 59

## 2022-06-06 ENCOUNTER — Observation Stay
Admission: EM | Admit: 2022-06-06 | Discharge: 2022-06-08 | Disposition: A | Discharge status: Home / Self Care | Payer: 59 | Attending: Surgery | Admitting: Surgery

## 2022-06-06 ENCOUNTER — Other Ambulatory Visit: Payer: Self-pay

## 2022-06-06 DIAGNOSIS — Z87891 Personal history of nicotine dependence: Secondary | ICD-10-CM | POA: Insufficient documentation

## 2022-06-06 DIAGNOSIS — K3580 Unspecified acute appendicitis: Secondary | ICD-10-CM | POA: Diagnosis not present

## 2022-06-06 DIAGNOSIS — I10 Essential (primary) hypertension: Secondary | ICD-10-CM | POA: Diagnosis not present

## 2022-06-06 DIAGNOSIS — Z98891 History of uterine scar from previous surgery: Secondary | ICD-10-CM

## 2022-06-06 DIAGNOSIS — R109 Unspecified abdominal pain: Secondary | ICD-10-CM | POA: Diagnosis present

## 2022-06-06 DIAGNOSIS — R1031 Right lower quadrant pain: Secondary | ICD-10-CM | POA: Diagnosis not present

## 2022-06-06 DIAGNOSIS — Z6841 Body Mass Index (BMI) 40.0 and over, adult: Secondary | ICD-10-CM | POA: Diagnosis not present

## 2022-06-06 LAB — COMPREHENSIVE METABOLIC PANEL
ALT: 28 U/L (ref 0–44)
AST: 24 U/L (ref 15–41)
Albumin: 4.2 g/dL (ref 3.5–5.0)
Alkaline Phosphatase: 87 U/L (ref 38–126)
Anion gap: 9 (ref 5–15)
BUN: 8 mg/dL (ref 6–20)
CO2: 23 mmol/L (ref 22–32)
Calcium: 8.6 mg/dL — ABNORMAL LOW (ref 8.9–10.3)
Chloride: 107 mmol/L (ref 98–111)
Creatinine, Ser: 0.73 mg/dL (ref 0.44–1.00)
GFR, Estimated: >60 mL/min (ref >60)
Glucose, Bld: 128 mg/dL — ABNORMAL HIGH (ref 70–99)
Potassium: 3.6 mmol/L (ref 3.5–5.1)
Sodium: 139 mmol/L (ref 135–145)
Total Bilirubin: 0.9 mg/dL (ref 0.3–1.2)
Total Protein: 7.6 g/dL (ref 6.5–8.1)

## 2022-06-06 LAB — URINALYSIS, ROUTINE W REFLEX MICROSCOPIC
Bilirubin Urine: NEGATIVE
Glucose, UA: NEGATIVE mg/dL
Hgb urine dipstick: NEGATIVE
Ketones, ur: NEGATIVE mg/dL
Nitrite: NEGATIVE
Protein, ur: 30 mg/dL — AB
Specific Gravity, Urine: 1.024 (ref 1.005–1.030)
pH: 8 (ref 5.0–8.0)

## 2022-06-06 LAB — CBC
HCT: 44.8 % (ref 36.0–46.0)
Hemoglobin: 15.1 g/dL — ABNORMAL HIGH (ref 12.0–15.0)
MCH: 30.2 pg (ref 26.0–34.0)
MCHC: 33.7 g/dL (ref 30.0–36.0)
MCV: 89.6 fL (ref 80.0–100.0)
Platelets: 379 10*3/uL (ref 150–400)
RBC: 5 MIL/uL (ref 3.87–5.11)
RDW: 12.2 % (ref 11.5–15.5)
WBC: 15.1 10*3/uL — ABNORMAL HIGH (ref 4.0–10.5)
nRBC: 0 % (ref 0.0–0.2)

## 2022-06-06 LAB — LIPASE, BLOOD: Lipase: 30 U/L (ref 11–51)

## 2022-06-06 LAB — POC URINE PREG, ED: Preg Test, Ur: NEGATIVE

## 2022-06-06 MED ORDER — ONDANSETRON HCL 4 MG/2ML IJ SOLN
4.0000 mg | Freq: Four times a day (QID) | INTRAMUSCULAR | Status: DC | PRN
  Administered 2022-06-07 – 2022-06-08 (×2): 4 mg via INTRAVENOUS
  Filled 2022-06-06 (×2): qty 2

## 2022-06-06 MED ORDER — PIPERACILLIN-TAZOBACTAM 3.375 G IVPB 30 MIN: 3.3750 g | Freq: Once | INTRAVENOUS | Status: DC

## 2022-06-06 MED ORDER — OXYCODONE HCL 5 MG PO TABS
5.0000 mg | ORAL_TABLET | ORAL | Status: DC | PRN
  Administered 2022-06-06 – 2022-06-07 (×2): 5 mg via ORAL
  Administered 2022-06-08 (×2): 10 mg via ORAL
  Filled 2022-06-06: qty 2
  Filled 2022-06-06: qty 1
  Filled 2022-06-06: qty 2
  Filled 2022-06-06: qty 1

## 2022-06-06 MED ORDER — PANTOPRAZOLE SODIUM 40 MG IV SOLR
40.0000 mg | Freq: Every day | INTRAVENOUS | Status: DC
  Administered 2022-06-06 – 2022-06-07 (×2): 40 mg via INTRAVENOUS
  Filled 2022-06-06 (×2): qty 10

## 2022-06-06 MED ORDER — MORPHINE SULFATE (PF) 4 MG/ML IV SOLN
4.0000 mg | Freq: Once | INTRAVENOUS | Status: AC
  Administered 2022-06-06: 4 mg via INTRAVENOUS
  Filled 2022-06-06: qty 1

## 2022-06-06 MED ORDER — ACETAMINOPHEN 500 MG PO TABS
1000.0000 mg | ORAL_TABLET | Freq: Four times a day (QID) | ORAL | Status: DC
  Administered 2022-06-06 – 2022-06-08 (×4): 1000 mg via ORAL
  Filled 2022-06-06 (×4): qty 2

## 2022-06-06 MED ORDER — LACTATED RINGERS IV SOLN: INTRAVENOUS | Status: DC

## 2022-06-06 MED ORDER — LACTATED RINGERS IV BOLUS
1000.0000 mL | Freq: Once | INTRAVENOUS | Status: AC
  Administered 2022-06-06: 1000 mL via INTRAVENOUS

## 2022-06-06 MED ORDER — PIPERACILLIN-TAZOBACTAM 3.375 G IVPB
3.3750 g | Freq: Three times a day (TID) | INTRAVENOUS | Status: DC
  Administered 2022-06-06 – 2022-06-08 (×5): 3.375 g via INTRAVENOUS
  Filled 2022-06-06 (×6): qty 50

## 2022-06-06 MED ORDER — POLYETHYLENE GLYCOL 3350 17 G PO PACK: 17.0000 g | PACK | Freq: Every day | ORAL | Status: DC | PRN

## 2022-06-06 MED ORDER — IOHEXOL 300 MG/ML  SOLN
100.0000 mL | Freq: Once | INTRAMUSCULAR | Status: AC | PRN
  Administered 2022-06-06: 100 mL via INTRAVENOUS

## 2022-06-06 MED ORDER — HYDROMORPHONE HCL 1 MG/ML IJ SOLN
0.5000 mg | INTRAMUSCULAR | Status: DC | PRN
  Administered 2022-06-07: 0.5 mg via INTRAVENOUS
  Filled 2022-06-06: qty 0.5

## 2022-06-06 MED ORDER — ONDANSETRON HCL 4 MG/2ML IJ SOLN
4.0000 mg | Freq: Once | INTRAMUSCULAR | Status: AC
  Administered 2022-06-06: 4 mg via INTRAVENOUS
  Filled 2022-06-06: qty 2

## 2022-06-06 MED ORDER — ONDANSETRON 4 MG PO TBDP
4.0000 mg | ORAL_TABLET | Freq: Four times a day (QID) | ORAL | Status: DC | PRN
  Administered 2022-06-08: 4 mg via ORAL
  Filled 2022-06-06: qty 1

## 2022-06-06 NOTE — Progress Notes (Signed)
06/06/22  Discussed case with Dr. Sidney Ace.  Patient presents with 1 day history of RLQ abdominal pain.  ER workup shows WBC of 15.1 and CT scan shows an appendix dilated to 8 mm with some mild surrounding stranding.  I have personally viewed the images and agree with the findings.  Will admit to surgical team, keep NPO, IV fluids, IV antibiotics and add her to OR schedule for lap appendectomy on 06/07/22 pending anesthesia team availability.  Full H&P to follow in AM.  Henrene Dodge, MD

## 2022-06-06 NOTE — ED Triage Notes (Addendum)
Pt has right side abd pain since this am.  Pt has nausea. No dysuria. No vag discharge or bleeding.   Pt drinking water in triage.   Pt alert

## 2022-06-06 NOTE — ED Notes (Signed)
Pt stated they saw small amount of blood in her urine when giving her sample.

## 2022-06-06 NOTE — ED Provider Notes (Addendum)
[facility] Provider Note    Event Date/Time   First MD Initiated Contact with Patient 06/06/22 2201     (approximate)   History   Abdominal Pain   HPI  Colleen Nicholson is a 26 y.o. female who presents with RLQ abdominal pain.  Symptoms started this morning.  Pain is in the right lower quadrant has been the entire time.  Is constant.  She has had decreased appetite some nausea but no vomiting has had loose stools today.  No fevers or chills.  Denies vaginal bleeding or urinary symptoms.  No history of similar.  Has had a C-section    Past Medical History:  Diagnosis Date   Amenorrhea    History of gestational hypertension 02/11/2016   Baseline P:C ratio 59   History of placenta abruption 02/11/2016   History of preterm delivery, currently pregnant in second trimester 02/21/2016   Obesity    VBAC (vaginal birth after Cesarean)     Patient Active Problem List   Diagnosis Date Noted   Acute appendicitis 06/06/2022   Preventative health care 02/15/2022   Other fatigue 02/15/2022   Food insecurity 02/15/2022   Prediabetes 05/12/2020   Hyperlipidemia 05/12/2020   ?Hypertriglyceridemia 05/12/2020   Elevated ALT measurement 05/12/2020   Morbid obesity (HCC) 07/13/2016   Vapes nicotine containing substance 07/21/2015     Physical Exam  Triage Vital Signs: ED Triage Vitals  Enc Vitals Group     BP 06/06/22 2144 (!) 145/88     Pulse Rate 06/06/22 2144 98     Resp 06/06/22 2144 18     Temp 06/06/22 2144 98.5 F (36.9 C)     Temp Source 06/06/22 2144 Oral     SpO2 06/06/22 2144 98 %     Weight 06/06/22 2141 250 lb (113.4 kg)     Height 06/06/22 2141 5\' 5"  (1.651 m)     Head Circumference --      Peak Flow --      Pain Score 06/06/22 2141 9     Pain Loc --      Pain Edu? --      Excl. in GC? --     Most recent vital signs: Vitals:   06/06/22 2144  BP: (!) 145/88  Pulse: 98  Resp: 18  Temp: 98.5 F (36.9 C)  SpO2: 98%      General: Awake, no distress.  CV:  Good peripheral perfusion.  Resp:  Normal effort.  Abd:  No distention.  Abdomen is obese, tenderness to palpation in the right lower quadrant with some voluntary guarding but abdomen is soft Neuro:             Awake, Alert, Oriented x 3  Other:     ED Results / Procedures / Treatments  Labs (all labs ordered are listed, but only abnormal results are displayed) Labs Reviewed  COMPREHENSIVE METABOLIC PANEL - Abnormal; Notable for the following components:      Result Value   Glucose, Bld 128 (*)    Calcium 8.6 (*)    All other components within normal limits  CBC - Abnormal; Notable for the following components:   WBC 15.1 (*)    Hemoglobin 15.1 (*)    All other components within normal limits  URINALYSIS, ROUTINE W REFLEX MICROSCOPIC - Abnormal; Notable for the following components:   Color, Urine YELLOW (*)    APPearance CLOUDY (*)    Protein, ur 30 (*)  Leukocytes,Ua TRACE (*)    Bacteria, UA RARE (*)    All other components within normal limits  LIPASE, BLOOD  HIV ANTIBODY (ROUTINE TESTING W REFLEX)  BASIC METABOLIC PANEL  MAGNESIUM  CBC  POC URINE PREG, ED     EKG     RADIOLOGY I reviewed and interpreted the CT abdomen pelvis which is concerning for acute appendicitis.   PROCEDURES:  Critical Care performed: No  Procedures  The patient is on the cardiac monitor to evaluate for evidence of arrhythmia and/or significant heart rate changes.   MEDICATIONS ORDERED IN ED: Medications  lactated ringers infusion (has no administration in time range)  acetaminophen (TYLENOL) tablet 1,000 mg (has no administration in time range)  oxyCODONE (Oxy IR/ROXICODONE) immediate release tablet 5-10 mg (has no administration in time range)  HYDROmorphone (DILAUDID) injection 0.5 mg (has no administration in time range)  polyethylene glycol (MIRALAX / GLYCOLAX) packet 17 g (has no administration in time range)  ondansetron  (ZOFRAN-ODT) disintegrating tablet 4 mg (has no administration in time range)    Or  ondansetron (ZOFRAN) injection 4 mg (has no administration in time range)  pantoprazole (PROTONIX) injection 40 mg (40 mg Intravenous Given 06/06/22 2325)  piperacillin-tazobactam (ZOSYN) IVPB 3.375 g (has no administration in time range)  morphine (PF) 4 MG/ML injection 4 mg (4 mg Intravenous Given 06/06/22 2224)  ondansetron (ZOFRAN) injection 4 mg (4 mg Intravenous Given 06/06/22 2224)  iohexol (OMNIPAQUE) 300 MG/ML solution 100 mL (100 mLs Intravenous Contrast Given 06/06/22 2241)  lactated ringers bolus 1,000 mL (1,000 mLs Intravenous New Bag/Given 06/06/22 2324)     IMPRESSION / MDM / ASSESSMENT AND PLAN / ED COURSE  I reviewed the triage vital signs and the nursing notes.                              Patient's presentation is most consistent with acute presentation with potential threat to life or bodily function.  Differential diagnosis includes, but is not limited to, acute appendicitis, pyelonephritis, mesenteric adenitis, enteritis, ovarian cyst ovarian torsion  Patient is a 26 year old female who presents with a day of right lower quadrant pain nausea decreased appetite.  Vitals are reassuring.  She is tender in the right lower quadrant with some voluntary guarding.  I am concerned for appendicitis.  Will obtain labs and CT.  We will give morphine and Zofran.   She has a white count of 15, 6-10 white cells in the urine.  CT pending.  CT is concerning for early appendicitis with appendicolith and stranding.  I discussed with Dr. Hampton Abbot.  We will put order for Zosyn fluids.  She will be admitted.     FINAL CLINICAL IMPRESSION(S) / ED DIAGNOSES   Final diagnoses:  Acute appendicitis, unspecified acute appendicitis type     Rx / DC Orders   ED Discharge Orders     None        Note:  This document was prepared using Dragon voice recognition software and may include unintentional  dictation errors.   Rada Hay, MD 06/06/22 2258    Rada Hay, MD 06/06/22 2325

## 2022-06-07 ENCOUNTER — Encounter: Payer: Self-pay | Admitting: Surgery

## 2022-06-07 ENCOUNTER — Encounter
Admission: EM | Disposition: A | Discharge status: Home / Self Care | Payer: Self-pay | Source: Home / Self Care | Attending: Emergency Medicine

## 2022-06-07 ENCOUNTER — Observation Stay: Payer: 59 | Admitting: Certified Registered"

## 2022-06-07 ENCOUNTER — Other Ambulatory Visit: Payer: Self-pay

## 2022-06-07 DIAGNOSIS — K3580 Unspecified acute appendicitis: Secondary | ICD-10-CM

## 2022-06-07 HISTORY — PX: LAPAROSCOPIC APPENDECTOMY: SHX408

## 2022-06-07 LAB — CBC
HCT: 39.1 % (ref 36.0–46.0)
Hemoglobin: 13.3 g/dL (ref 12.0–15.0)
MCH: 30.9 pg (ref 26.0–34.0)
MCHC: 34 g/dL (ref 30.0–36.0)
MCV: 90.7 fL (ref 80.0–100.0)
Platelets: 302 10*3/uL (ref 150–400)
RBC: 4.31 MIL/uL (ref 3.87–5.11)
RDW: 12.3 % (ref 11.5–15.5)
WBC: 14.6 10*3/uL — ABNORMAL HIGH (ref 4.0–10.5)
nRBC: 0 % (ref 0.0–0.2)

## 2022-06-07 LAB — BASIC METABOLIC PANEL
Anion gap: 7 (ref 5–15)
BUN: 7 mg/dL (ref 6–20)
CO2: 23 mmol/L (ref 22–32)
Calcium: 8.1 mg/dL — ABNORMAL LOW (ref 8.9–10.3)
Chloride: 111 mmol/L (ref 98–111)
Creatinine, Ser: 0.63 mg/dL (ref 0.44–1.00)
GFR, Estimated: >60 mL/min (ref >60)
Glucose, Bld: 123 mg/dL — ABNORMAL HIGH (ref 70–99)
Potassium: 3.3 mmol/L — ABNORMAL LOW (ref 3.5–5.1)
Sodium: 141 mmol/L (ref 135–145)

## 2022-06-07 LAB — MAGNESIUM: Magnesium: 1.9 mg/dL (ref 1.7–2.4)

## 2022-06-07 LAB — SURGICAL PCR SCREEN
MRSA, PCR: NEGATIVE
Staphylococcus aureus: NEGATIVE

## 2022-06-07 LAB — HIV ANTIBODY (ROUTINE TESTING W REFLEX): HIV Screen 4th Generation wRfx: NONREACTIVE

## 2022-06-07 SURGERY — APPENDECTOMY, LAPAROSCOPIC
Anesthesia: General | Site: Abdomen

## 2022-06-07 MED ORDER — ROCURONIUM BROMIDE 100 MG/10ML IV SOLN
INTRAVENOUS | Status: DC | PRN
  Administered 2022-06-07: 5 mg via INTRAVENOUS
  Administered 2022-06-07: 40 mg via INTRAVENOUS
  Administered 2022-06-07: 10 mg via INTRAVENOUS
  Administered 2022-06-07: 20 mg via INTRAVENOUS
  Administered 2022-06-07: 10 mg via INTRAVENOUS

## 2022-06-07 MED ORDER — LACTATED RINGERS IV SOLN: INTRAVENOUS | Status: DC

## 2022-06-07 MED ORDER — OXYCODONE HCL 5 MG PO TABS
ORAL_TABLET | ORAL | Status: AC
  Filled 2022-06-07: qty 1

## 2022-06-07 MED ORDER — PROPOFOL 10 MG/ML IV BOLUS
INTRAVENOUS | Status: AC
  Filled 2022-06-07: qty 20

## 2022-06-07 MED ORDER — OXYCODONE HCL 5 MG PO TABS
5.0000 mg | ORAL_TABLET | Freq: Once | ORAL | Status: AC | PRN
  Administered 2022-06-07: 5 mg via ORAL

## 2022-06-07 MED ORDER — DEXAMETHASONE SODIUM PHOSPHATE 10 MG/ML IJ SOLN
INTRAMUSCULAR | Status: DC | PRN
  Administered 2022-06-07: 10 mg via INTRAVENOUS

## 2022-06-07 MED ORDER — FENTANYL CITRATE (PF) 100 MCG/2ML IJ SOLN
INTRAMUSCULAR | Status: AC
  Filled 2022-06-07: qty 2

## 2022-06-07 MED ORDER — FENTANYL CITRATE (PF) 100 MCG/2ML IJ SOLN: 25.0000 ug | INTRAMUSCULAR | Status: DC | PRN

## 2022-06-07 MED ORDER — MIDAZOLAM HCL 2 MG/2ML IJ SOLN
INTRAMUSCULAR | Status: DC | PRN
  Administered 2022-06-07: 2 mg via INTRAVENOUS

## 2022-06-07 MED ORDER — 0.9 % SODIUM CHLORIDE (POUR BTL) OPTIME
TOPICAL | Status: DC | PRN
  Administered 2022-06-07: 500 mL

## 2022-06-07 MED ORDER — OXYCODONE HCL 5 MG/5ML PO SOLN: 5.0000 mg | Freq: Once | ORAL | Status: AC | PRN

## 2022-06-07 MED ORDER — SODIUM CHLORIDE 0.9 % IR SOLN
Status: DC | PRN
  Administered 2022-06-07: 1000 mL

## 2022-06-07 MED ORDER — PROPOFOL 10 MG/ML IV BOLUS
INTRAVENOUS | Status: DC | PRN
  Administered 2022-06-07: 150 mg via INTRAVENOUS
  Administered 2022-06-07: 50 mg via INTRAVENOUS

## 2022-06-07 MED ORDER — POTASSIUM CHLORIDE CRYS ER 20 MEQ PO TBCR
40.0000 meq | EXTENDED_RELEASE_TABLET | Freq: Once | ORAL | Status: AC
  Administered 2022-06-07: 40 meq via ORAL
  Filled 2022-06-07: qty 2

## 2022-06-07 MED ORDER — HYDROMORPHONE HCL 1 MG/ML IJ SOLN
INTRAMUSCULAR | Status: AC
  Filled 2022-06-07: qty 1

## 2022-06-07 MED ORDER — ONDANSETRON HCL 4 MG/2ML IJ SOLN
4.0000 mg | Freq: Once | INTRAMUSCULAR | Status: AC | PRN
  Administered 2022-06-07: 4 mg via INTRAVENOUS

## 2022-06-07 MED ORDER — ACETAMINOPHEN 10 MG/ML IV SOLN: 1000.0000 mg | Freq: Once | INTRAVENOUS | Status: DC | PRN

## 2022-06-07 MED ORDER — MUPIROCIN 2 % EX OINT
1.0000 | TOPICAL_OINTMENT | Freq: Two times a day (BID) | CUTANEOUS | Status: DC
  Administered 2022-06-07 – 2022-06-08 (×3): 1 via NASAL
  Filled 2022-06-07: qty 22

## 2022-06-07 MED ORDER — LIDOCAINE HCL (CARDIAC) PF 100 MG/5ML IV SOSY
PREFILLED_SYRINGE | INTRAVENOUS | Status: DC | PRN
  Administered 2022-06-07: 100 mg via INTRAVENOUS

## 2022-06-07 MED ORDER — PROCHLORPERAZINE EDISYLATE 10 MG/2ML IJ SOLN
5.0000 mg | INTRAMUSCULAR | Status: DC | PRN
  Administered 2022-06-07: 5 mg via INTRAVENOUS
  Filled 2022-06-07 (×2): qty 1

## 2022-06-07 MED ORDER — BUPIVACAINE LIPOSOME 1.3 % IJ SUSP
INTRAMUSCULAR | Status: AC
  Filled 2022-06-07: qty 10

## 2022-06-07 MED ORDER — GLYCOPYRROLATE 0.2 MG/ML IJ SOLN
INTRAMUSCULAR | Status: DC | PRN
  Administered 2022-06-07: .2 mg via INTRAVENOUS

## 2022-06-07 MED ORDER — MIDAZOLAM HCL 2 MG/2ML IJ SOLN
INTRAMUSCULAR | Status: AC
  Filled 2022-06-07: qty 2

## 2022-06-07 MED ORDER — SUGAMMADEX SODIUM 200 MG/2ML IV SOLN
INTRAVENOUS | Status: DC | PRN
  Administered 2022-06-07: 300 mg via INTRAVENOUS

## 2022-06-07 MED ORDER — BUPIVACAINE-EPINEPHRINE (PF) 0.5% -1:200000 IJ SOLN
INTRAMUSCULAR | Status: DC | PRN
  Administered 2022-06-07: 40 mL

## 2022-06-07 MED ORDER — ONDANSETRON HCL 4 MG/2ML IJ SOLN
INTRAMUSCULAR | Status: DC | PRN
  Administered 2022-06-07: 4 mg via INTRAVENOUS

## 2022-06-07 MED ORDER — FENTANYL CITRATE (PF) 100 MCG/2ML IJ SOLN
INTRAMUSCULAR | Status: DC | PRN
  Administered 2022-06-07: 50 ug via INTRAVENOUS
  Administered 2022-06-07: 100 ug via INTRAVENOUS
  Administered 2022-06-07: 50 ug via INTRAVENOUS

## 2022-06-07 MED ORDER — BUPIVACAINE-EPINEPHRINE (PF) 0.5% -1:200000 IJ SOLN
INTRAMUSCULAR | Status: AC
  Filled 2022-06-07: qty 30

## 2022-06-07 MED ORDER — ONDANSETRON HCL 4 MG/2ML IJ SOLN
INTRAMUSCULAR | Status: AC
  Filled 2022-06-07: qty 2

## 2022-06-07 MED ORDER — DEXMEDETOMIDINE HCL IN NACL 80 MCG/20ML IV SOLN
INTRAVENOUS | Status: DC | PRN
  Administered 2022-06-07 (×3): 8 ug via BUCCAL

## 2022-06-07 MED ORDER — HYDROMORPHONE HCL 1 MG/ML IJ SOLN
INTRAMUSCULAR | Status: DC | PRN
  Administered 2022-06-07 (×2): .25 mg via INTRAVENOUS
  Administered 2022-06-07: .5 mg via INTRAVENOUS

## 2022-06-07 MED ORDER — SUCCINYLCHOLINE CHLORIDE 200 MG/10ML IV SOSY
PREFILLED_SYRINGE | INTRAVENOUS | Status: DC | PRN
  Administered 2022-06-07: 100 mg via INTRAVENOUS

## 2022-06-07 SURGICAL SUPPLY — 49 items
ADH SKN CLS APL DERMABOND .7 (GAUZE/BANDAGES/DRESSINGS) ×1
BULB RESERV EVAC DRAIN JP 100C (MISCELLANEOUS) IMPLANT
CUTTER FLEX LINEAR 45M (STAPLE) IMPLANT
DERMABOND ADVANCED .7 DNX12 (GAUZE/BANDAGES/DRESSINGS) ×1 IMPLANT
DRAIN CHANNEL JP 19F (MISCELLANEOUS) IMPLANT
ELECT CAUTERY BLADE TIP 2.5 (TIP) ×1
ELECT REM PT RETURN 9FT ADLT (ELECTROSURGICAL) ×1
ELECTRODE CAUTERY BLDE TIP 2.5 (TIP) ×1 IMPLANT
ELECTRODE REM PT RTRN 9FT ADLT (ELECTROSURGICAL) ×1 IMPLANT
GLOVE SURG SYN 7.0 (GLOVE) ×1 IMPLANT
GLOVE SURG SYN 7.0 PF PI (GLOVE) ×1 IMPLANT
GLOVE SURG SYN 7.5  E (GLOVE) ×1
GLOVE SURG SYN 7.5 E (GLOVE) ×1 IMPLANT
GLOVE SURG SYN 7.5 PF PI (GLOVE) ×1 IMPLANT
GOWN STRL REUS W/ TWL LRG LVL3 (GOWN DISPOSABLE) ×2 IMPLANT
GOWN STRL REUS W/TWL LRG LVL3 (GOWN DISPOSABLE) ×2
IRRIGATION STRYKERFLOW (MISCELLANEOUS) IMPLANT
IRRIGATOR STRYKERFLOW (MISCELLANEOUS) ×1
IV NS 1000ML (IV SOLUTION) ×1
IV NS 1000ML BAXH (IV SOLUTION) ×1 IMPLANT
KIT TURNOVER KIT A (KITS) ×1 IMPLANT
LABEL OR SOLS (LABEL) ×1 IMPLANT
LIGASURE LAP MARYLAND 5MM 37CM (ELECTROSURGICAL) IMPLANT
MANIFOLD NEPTUNE II (INSTRUMENTS) ×1 IMPLANT
NEEDLE HYPO 22GX1.5 SAFETY (NEEDLE) ×1 IMPLANT
NS IRRIG 500ML POUR BTL (IV SOLUTION) ×1 IMPLANT
PACK LAP CHOLECYSTECTOMY (MISCELLANEOUS) ×1 IMPLANT
PENCIL SMOKE EVACUATOR (MISCELLANEOUS) IMPLANT
RELOAD 45 VASCULAR/THIN (ENDOMECHANICALS) IMPLANT
RELOAD STAPLE 45 2.5 WHT GRN (ENDOMECHANICALS) IMPLANT
RELOAD STAPLE 45 3.5 BLU ETS (ENDOMECHANICALS) ×1 IMPLANT
RELOAD STAPLE TA45 3.5 REG BLU (ENDOMECHANICALS) ×2 IMPLANT
SCISSORS METZENBAUM CVD 33 (INSTRUMENTS) ×1 IMPLANT
SLEEVE ADV FIXATION 5X100MM (TROCAR) ×1 IMPLANT
SUT ETHILON 3-0 (SUTURE) IMPLANT
SUT MNCRL 4-0 (SUTURE) ×1
SUT MNCRL 4-0 27XMFL (SUTURE) ×1
SUT VIC AB 3-0 SH 27 (SUTURE) ×1
SUT VIC AB 3-0 SH 27X BRD (SUTURE) IMPLANT
SUT VICRYL 0 UR6 27IN ABS (SUTURE) ×1 IMPLANT
SUTURE MNCRL 4-0 27XMF (SUTURE) ×1 IMPLANT
SYS BAG RETRIEVAL 10MM (BASKET) ×1
SYS KII FIOS ACCESS ABD 5X100 (TROCAR) ×1
SYSTEM BAG RETRIEVAL 10MM (BASKET) ×1 IMPLANT
SYSTEM KII FIOS ACES ABD 5X100 (TROCAR) ×1 IMPLANT
TRAP FLUID SMOKE EVACUATOR (MISCELLANEOUS) ×1 IMPLANT
TRAY FOLEY MTR SLVR 16FR STAT (SET/KITS/TRAYS/PACK) ×1 IMPLANT
TROCAR BALLN GELPORT 12X130M (ENDOMECHANICALS) ×1 IMPLANT
TUBING EVAC SMOKE HEATED PNEUM (TUBING) ×1 IMPLANT

## 2022-06-07 NOTE — Anesthesia Preprocedure Evaluation (Addendum)
Anesthesia Evaluation  Patient identified by MRN, date of birth, ID band Patient awake    Reviewed: Allergy & Precautions, NPO status , Patient's Chart, lab work & pertinent test results  History of Anesthesia Complications Negative for: history of anesthetic complications  Airway Mallampati: I   Neck ROM: Full    Dental  (+) Chipped   Pulmonary former smoker (quit 2021) Current vaping   Pulmonary exam normal breath sounds clear to auscultation       Cardiovascular hypertension, Normal cardiovascular exam Rhythm:Regular Rate:Normal     Neuro/Psych negative neurological ROS     GI/Hepatic ,GERD  ,,  Endo/Other  Class 3 obesity  Renal/GU negative Renal ROS     Musculoskeletal   Abdominal   Peds  Hematology negative hematology ROS (+)   Anesthesia Other Findings   Reproductive/Obstetrics                             Anesthesia Physical Anesthesia Plan  ASA: 3 and emergent  Anesthesia Plan: General   Post-op Pain Management:    Induction: Intravenous  PONV Risk Score and Plan: 3 and Ondansetron, Dexamethasone and Treatment may vary due to age or medical condition  Airway Management Planned: Oral ETT  Additional Equipment:   Intra-op Plan:   Post-operative Plan: Extubation in OR  Informed Consent: I have reviewed the patients History and Physical, chart, labs and discussed the procedure including the risks, benefits and alternatives for the proposed anesthesia with the patient or authorized representative who has indicated his/her understanding and acceptance.     Dental advisory given  Plan Discussed with: CRNA  Anesthesia Plan Comments: (Patient consented for risks of anesthesia including but not limited to:  - adverse reactions to medications - damage to eyes, teeth, lips or other oral mucosa - nerve damage due to positioning  - sore throat or hoarseness - damage to  heart, brain, nerves, lungs, other parts of body or loss of life  Informed patient about role of CRNA in peri- and intra-operative care.  Patient voiced understanding.)       Anesthesia Quick Evaluation

## 2022-06-07 NOTE — H&P (Signed)
[facility] SURGICAL HISTORY & PHYSICAL (cpt [phone])  HISTORY OF PRESENT ILLNESS (HPI):  26 y.o. female presented to [facility] ED last night for abdominal pain. Patient reports the acute onset of sharp RLQ abdominal pain last night around 630 PM. This was constant in nature and worsened with movement. She reports associated nausea and diarrhea with he pain. No fever, chills, CP, SOB, emesis, or urinary changes. No history of similar. Only previous abdominal surgery is C-section x1. Work up in the ED revealed a leukocytosis to 15.1K, CMP was reassuring, and CT Abdomen/Pelvis was concerning for acute appendicitis.   General surgery is consulted by emergency medicine physician Dr Augusto Gamble, MD for evaluation and management of acute appendicitis.   PAST MEDICAL HISTORY (PMH):  Past Medical History:  Diagnosis Date   Amenorrhea    History of gestational hypertension 02/11/2016   Baseline P:C ratio 59   History of placenta abruption 02/11/2016   History of preterm delivery, currently pregnant in second trimester 02/21/2016   Obesity    VBAC (vaginal birth after Cesarean)     Reviewed. Otherwise negative.   PAST SURGICAL HISTORY (PSH):  Past Surgical History:  Procedure Laterality Date   CESAREAN SECTION     INSERTION OF IMPLANON ROD  03/09/2020       REMOVAL OF IMPLANON ROD  03/09/2020        Reviewed. Otherwise negative.   MEDICATIONS:  Prior to Admission medications   Medication Sig Start Date End Date Taking? Authorizing Provider  Etonogestrel (NEXPLANON [place]) Inject into the skin. 03/09/20 03/10/17   Yes [provider]     ALLERGIES:  No Known Allergies   SOCIAL HISTORY:  Social History   Socioeconomic History   Marital status: Married    Spouse name: Not on file   Number of children: 2   Years of education: Not on file   Highest education level: Not on file  Occupational History   Not on file  Tobacco Use   Smoking status: Former    Packs/day: 0.50     Years: 5.00    Total pack years: 2.50    Types: Cigarettes    Quit date: 10/17/2019    Years since quitting: 2.6    Passive exposure: Past   Smokeless tobacco: Never  Vaping Use   Vaping Use: Every day   Substances: Nicotine, Flavoring  Substance and Sexual Activity   Alcohol use: No    Alcohol/week: 0.0 standard drinks of alcohol   Drug use: No   Sexual activity: Yes    Birth control/protection: Implant  Other Topics Concern   Not on file  Social History Narrative   Door Dash   Social Determinants of Health   Financial Resource Strain: Not on file  Food Insecurity: No Food Insecurity (02/15/2022)   Hunger Vital Sign    Worried About Running Out of Food in the Last Year: Never true    Ran Out of Food in the Last Year: Never true  Transportation Needs: Not on file  Physical Activity: Not on file  Stress: Not on file  Social Connections: Not on file  Intimate Partner Violence: Not on file     FAMILY HISTORY:  Family History  Problem Relation Age of Onset   COPD Mother    Asthma Mother    Fibromyalgia Maternal Grandmother    Healthy Half-Brother     Otherwise negative.   REVIEW OF SYSTEMS:  Review of Systems  Constitutional:  Negative for chills and  fever.  HENT:  Negative for congestion and sore throat.   Respiratory:  Negative for cough and shortness of breath.   Cardiovascular:  Negative for chest pain and palpitations.  Gastrointestinal:  Positive for abdominal pain, diarrhea and nausea. Negative for blood in stool, constipation and vomiting.  Genitourinary:  Negative for dysuria and urgency.  All other systems reviewed and are negative.   VITAL SIGNS:  Temp:  [98.4 F (36.9 C)-98.6 F (37 C)] 98.4 F (36.9 C) (11/21 0200) Pulse Rate:  [73-98] 87 (11/21 0700) Resp:  [16-25] 19 (11/21 0700) BP: (114-145)/(61-88) 127/76 (11/21 0700) SpO2:  [95 %-100 %] 99 % (11/21 0700) Weight:  [113.4 kg] 113.4 kg (11/20 2141)     Height: 5\' 5"  (165.1 cm) Weight: 113.4  kg BMI (Calculated): 41.6   PHYSICAL EXAM:  Physical Exam Vitals and nursing note reviewed. Exam conducted with a chaperone present.  Constitutional:      General: She is not in acute distress.    Appearance: She is well-developed. She is obese. She is not ill-appearing.     Comments: Patient resting in bed, able to sit up with out distress  HENT:     Head: Normocephalic and atraumatic.  Eyes:     General: No scleral icterus.    Extraocular Movements: Extraocular movements intact.  Cardiovascular:     Rate and Rhythm: Normal rate.     Heart sounds: Normal heart sounds. No murmur heard. Pulmonary:     Effort: Pulmonary effort is normal. No respiratory distress.  Abdominal:     General: Abdomen is protuberant. There is no distension.     Palpations: Abdomen is soft.     Tenderness: There is abdominal tenderness in the right lower quadrant. There is no guarding. Positive signs include Rovsing's sign and McBurney's sign. Negative signs include Murphy's sign.     Comments: Abdomen is soft, she is tender in RLQ with positive McBurney's point and Rovsing sign, non-distended, no rebound/guarding  Genitourinary:    Comments: Deferred Skin:    General: Skin is warm and dry.     Coloration: Skin is not jaundiced or pale.  Neurological:     General: No focal deficit present.     Mental Status: She is alert and oriented to person, place, and time.  Psychiatric:        Mood and Affect: Mood normal.        Behavior: Behavior normal.     INTAKE/OUTPUT:  This shift: No intake/output data recorded.  Last 2 shifts: @IOLAST2SHIFTS @  Labs:     Latest Ref Rng & Units 06/07/2022    4:46 AM 06/06/2022    9:43 PM 02/15/2022   11:17 AM  CBC  WBC 4.0 - 10.5 K/uL 14.6  15.1  7.6   Hemoglobin 12.0 - 15.0 g/dL 06/08/2022  04/17/2022  08.6   Hematocrit 36.0 - 46.0 % 39.1  44.8  44.0   Platelets 150 - 400 K/uL 302  379  296.0       Latest Ref Rng & Units 06/07/2022    4:46 AM 06/06/2022    9:43 PM  02/15/2022   11:17 AM  CMP  Glucose 70 - 99 mg/dL 06/08/2022  04/17/2022  629   BUN 6 - 20 mg/dL 7  8  7    Creatinine 0.44 - 1.00 mg/dL 528  413    Sodium 135 - 145 mmol/L 141  139  139   Potassium 3.5 - 5.1 mmol/L 3.3  3.6  4.0   Chloride 98 - 111 mmol/L 111  107  105   CO2 22 - 32 mmol/L 23  23  25    Calcium 8.9 - 10.3 mg/dL 8.1  8.6  8.9   Total Protein 6.5 - 8.1 g/dL  7.6  6.9   Total Bilirubin 0.3 - 1.2 mg/dL  0.9  0.4   Alkaline Phos 38 - 126 U/L  87  92   AST 15 - 41 U/L  24  18   ALT 0 - 44 U/L  28  28      Imaging studies:   CT Abdomen/Pelvis (06/06/2022) personally reviewed with changes in appendix with appendicolith concerning for appendicitis, no evidence of perforation or abscess, and radiologist report reviewed below:  IMPRESSION: Appendix borderline in diameter at 8 mm. Small appendicolith within the appendix with slight haziness around the appendix. Findings concerning for early acute appendicitis.   Assessment/Plan: (ICD-10's: K35.80) 26 y.o. female with RLQ abdominal pain and leukocytosis concerning for acute appendicitis without perforation nor abscess.    - Will admit to general surgery - Plan for laparoscopic appendectomy this afternoon with Dr 34 pending OR/Anesthesia availability - All risks, benefits, and alternatives to above procedure(s) were discussed with the patient, all of her questions were answered to her expressed satisfaction, patient expresses she wishes to proceed, and informed consent was obtained.    - NPO for surgery  - IVF Support  - IV Abx (Zosyn)  - Monitor abdominal examination; on-going bowel function - Pain control prn; antiemetics prn   - DVT prophylaxis; hold for OR  All of the above findings and recommendations were discussed with the patient, and all of her questions were answered to her expressed satisfaction.  -- Aleen Campi, PA-C [facility] 06/07/2022, 7:10 AM M-F: 7am - 4pm

## 2022-06-07 NOTE — Anesthesia Procedure Notes (Signed)
Procedure Name: Intubation Date/Time: 06/07/2022 5:23 PM  Performed by: Adalberto Ill, CRNAPre-anesthesia Checklist: Patient identified, Emergency Drugs available, Suction available, Patient being monitored and Timeout performed Patient Re-evaluated:Patient Re-evaluated prior to induction Oxygen Delivery Method: Circle system utilized Preoxygenation: Pre-oxygenation with 100% oxygen Induction Type: IV induction Ventilation: Mask ventilation without difficulty Laryngoscope Size: Mac, McGraph and 3 Grade View: Grade I Tube type: Oral Tube size: 7.0 mm Number of attempts: 1 Placement Confirmation: ETT inserted through vocal cords under direct vision, positive ETCO2 and breath sounds checked- equal and bilateral Secured at: 19 cm Tube secured with: Tape Dental Injury: Teeth and Oropharynx as per pre-operative assessment

## 2022-06-07 NOTE — ED Notes (Signed)
Report given to [facility] nurse. Pt being transported by Caitlyn to floor in wheelchair pt stable with mom at bedside.

## 2022-06-07 NOTE — ED Notes (Signed)
Patient states pain worsened when she ambulated to bathroom. 9/10

## 2022-06-07 NOTE — ED Notes (Signed)
Pt A&Ox4. Pt relaxing in bed watching television with family at bedside. Vitals stable.

## 2022-06-07 NOTE — Transfer of Care (Signed)
Immediate Anesthesia Transfer of Care Note  Patient: Colleen Nicholson  Procedure(s) Performed: APPENDECTOMY LAPAROSCOPIC (Abdomen)  Patient Location: PACU  Anesthesia Type:General  Level of Consciousness: drowsy  Airway & Oxygen Therapy: Patient Spontanous Breathing and Patient connected to face mask oxygen  Post-op Assessment: Report given to RN  Post vital signs: stable  Last Vitals:  Vitals Value Taken Time  BP    Temp    Pulse 80 06/07/22 1945  Resp 17 06/07/22 1945  SpO2 100 % 06/07/22 1945  Vitals shown include unvalidated device data.  Last Pain:  Vitals:   06/07/22 1534  TempSrc: Oral  PainSc:          Complications: No notable events documented.

## 2022-06-07 NOTE — Op Note (Signed)
  Procedure Date:  06/07/2022  Pre-operative Diagnosis:  Acute appendicitis  Post-operative Diagnosis: Acute appendicitis  Procedure:  Laparoscopic appendectomy  Surgeon:  Howie Ill, MD  Anesthesia:  General endotracheal  Estimated Blood Loss:  30 ml  Specimens:  appendix  Complications:  None  Indications for Procedure:  This is a 26 y.o. female who presents with abdominal pain and workup revealing acute appendicitis.  The options of surgery versus observation were reviewed with the patient and/or family. The risks of bleeding, infection, recurrence of symptoms, negative laparoscopy, potential for an open procedure, bowel injury, abscess or infection, were all discussed with the patient and she was willing to proceed.  Description of Procedure: The patient was correctly identified in the preoperative area and brought into the operating room.  The patient was placed supine with VTE prophylaxis in place.  Appropriate time-outs were performed.  Anesthesia was induced and the patient was intubated.  Foley catheter was placed.  Appropriate antibiotics were infused.  The abdomen was prepped and draped in a sterile fashion. An infraumbilical incision was made. A cutdown technique was used to enter the abdominal cavity without injury, and a Hasson trocar was inserted.  Pneumoperitoneum was obtained with appropriate opening pressures.  Upon inspection with the camera, the patient was noted to have adhesions from the omentum to the anterior abdominal wall going from umbilicus down towards the pelvis.  This was not allowing appropriate visualization in order to place our additional ports.  I tried to place a suprapubic port, but was still within the omental adhesions.  As such, I placed a right lower quadrant 5 mm port under direct visualization.  The camera was transitioned to that port and LigaSure was used to take down the adhesions using the umbilical port.  After tedious work, the omentum  was freed and then the last left lower quadrant 5-mm port was placed under direct visualization.  The right lower quadrant was inspected and the appendix was identified and found to be acutely inflamed and adhered to the terminal ileum. This was separated using blunt dissection.  The appendix was carefully dissected.  The mesoappendix was divided using the LigaSure.  The base of the appendix was dissected out and divided with a standard load Endo GIA.  The appendix was placed in an Endocatch bag.  The right lower quadrant was then inspected again revealing an intact staple line, no bleeding, and no bowel injury.  The area was thoroughly irrigated.  A 19 Fr. Blake drain was placed through the left lower quadrant port and passed toward the pelvis and RLQ.   The 5 mm ports were removed under direct visualization and the Hasson trocar was removed.  The Endocatch bag was brought out through the umbilical incision.  The fascial opening was closed using 0 vicryl sutures.  Local anesthetic was infused in all incisions and the incisions were closed with 4-0 Monocryl.  The wounds were cleaned and sealed with DermaBond.  The drain was secured using 3-0 Nylon and dressed with 4x4 gauze and TegaDerm.  Foley catheter was removed and the patient was emerged from anesthesia and extubated and brought to the recovery room for further management.  The patient tolerated the procedure well and all counts were correct at the end of the case.   Howie Ill, MD

## 2022-06-07 NOTE — Anesthesia Postprocedure Evaluation (Signed)
Anesthesia Post Note  Patient: Colleen Nicholson  Procedure(s) Performed: APPENDECTOMY LAPAROSCOPIC (Abdomen)  Patient location during evaluation: PACU Anesthesia Type: General Level of consciousness: awake and alert, oriented and patient cooperative Pain management: pain level controlled Vital Signs Assessment: post-procedure vital signs reviewed and stable Respiratory status: spontaneous breathing, nonlabored ventilation and respiratory function stable Cardiovascular status: blood pressure returned to baseline and stable Postop Assessment: adequate PO intake Anesthetic complications: no   No notable events documented.   Last Vitals:  Vitals:   06/07/22 2015 06/07/22 2045  BP: 139/80 (!) 135/91  Pulse: 77 91  Resp: 13   Temp: 36.6 C 36.9 C  SpO2: 96% 100%    Last Pain:  Vitals:   06/07/22 2015  TempSrc:   PainSc: 1                  Reed Breech

## 2022-06-07 NOTE — ED Notes (Signed)
Pt consent completed for procedure.

## 2022-06-07 NOTE — ED Notes (Addendum)
Disconnected patient from monitor and assisted to restroom. Patient has steady gait.

## 2022-06-08 ENCOUNTER — Other Ambulatory Visit: Payer: Self-pay

## 2022-06-08 ENCOUNTER — Encounter: Payer: Self-pay | Admitting: Surgery

## 2022-06-08 ENCOUNTER — Other Ambulatory Visit: Payer: Self-pay | Admitting: Physician Assistant

## 2022-06-08 DIAGNOSIS — K3580 Unspecified acute appendicitis: Secondary | ICD-10-CM | POA: Diagnosis present

## 2022-06-08 DIAGNOSIS — Z6841 Body Mass Index (BMI) 40.0 and over, adult: Secondary | ICD-10-CM | POA: Diagnosis not present

## 2022-06-08 DIAGNOSIS — Z87891 Personal history of nicotine dependence: Secondary | ICD-10-CM | POA: Diagnosis not present

## 2022-06-08 DIAGNOSIS — Z98891 History of uterine scar from previous surgery: Secondary | ICD-10-CM | POA: Diagnosis not present

## 2022-06-08 DIAGNOSIS — R1031 Right lower quadrant pain: Secondary | ICD-10-CM | POA: Diagnosis present

## 2022-06-08 LAB — CBC
HCT: 41.4 % (ref 36.0–46.0)
Hemoglobin: 13.9 g/dL (ref 12.0–15.0)
MCH: 30.1 pg (ref 26.0–34.0)
MCHC: 33.6 g/dL (ref 30.0–36.0)
MCV: 89.6 fL (ref 80.0–100.0)
Platelets: 360 10*3/uL (ref 150–400)
RBC: 4.62 MIL/uL (ref 3.87–5.11)
RDW: 12.1 % (ref 11.5–15.5)
WBC: 13.6 10*3/uL — ABNORMAL HIGH (ref 4.0–10.5)
nRBC: 0 % (ref 0.0–0.2)

## 2022-06-08 LAB — BASIC METABOLIC PANEL
Anion gap: 6 (ref 5–15)
BUN: 6 mg/dL (ref 6–20)
CO2: 25 mmol/L (ref 22–32)
Calcium: 8.8 mg/dL — ABNORMAL LOW (ref 8.9–10.3)
Chloride: 106 mmol/L (ref 98–111)
Creatinine, Ser: 0.68 mg/dL (ref 0.44–1.00)
GFR, Estimated: >60 mL/min (ref >60)
Glucose, Bld: 130 mg/dL — ABNORMAL HIGH (ref 70–99)
Potassium: 4.1 mmol/L (ref 3.5–5.1)
Sodium: 137 mmol/L (ref 135–145)

## 2022-06-08 MED ORDER — OXYCODONE HCL 5 MG PO TABS: 5.0000 mg | ORAL_TABLET | Freq: Four times a day (QID) | ORAL | 0 refills | Status: DC | PRN

## 2022-06-08 MED ORDER — ACETAMINOPHEN 500 MG PO TABS
1000.0000 mg | ORAL_TABLET | Freq: Four times a day (QID) | ORAL | Status: DC
  Administered 2022-06-08: 1000 mg via ORAL
  Filled 2022-06-08: qty 2

## 2022-06-08 MED ORDER — AMOXICILLIN-POT CLAVULANATE 875-125 MG PO TABS: 1.0000 | ORAL_TABLET | Freq: Two times a day (BID) | ORAL | 0 refills | Status: AC

## 2022-06-08 MED ORDER — ONDANSETRON 4 MG PO TBDP: 4.0000 mg | ORAL_TABLET | Freq: Four times a day (QID) | ORAL | 0 refills | Status: DC | PRN

## 2022-06-08 MED ORDER — IBUPROFEN 600 MG PO TABS: 600.0000 mg | ORAL_TABLET | Freq: Four times a day (QID) | ORAL | 0 refills | Status: DC | PRN

## 2022-06-08 NOTE — Progress Notes (Signed)
Educated patient on draining JP and educated husband. No questions at this time. Patient was responsive to teaching and understands S/S of concern. Patient wheeled out by volunteer.

## 2022-06-08 NOTE — Discharge Instructions (Signed)
In addition to included general post-operative instructions,  Diet: Resume home diet.   Activity: No heavy lifting >20 pounds (children, pets, laundry, garbage) or strenuous activity for 4 weeks, but light activity and walking are encouraged. Do not drive or drink alcohol if taking narcotic pain medications or having pain that might distract from driving.  Wound care: If you can keep drain site covered and water proofed, 2 days after surgery (11/23), you may shower/get incision wet with soapy water and pat dry (do not rub incisions), but no baths or submerging incision underwater until follow-up.  Drain: Monitor and record drain output daily. You were given a handout for this. Please bring this with you to your follow up appointment.    Medications: Resume all home medications. For mild to moderate pain: acetaminophen (Tylenol) or ibuprofen/naproxen (if no kidney disease). Combining Tylenol with alcohol can substantially increase your risk of causing liver disease. Narcotic pain medications, if prescribed, can be used for severe pain, though may cause nausea, constipation, and drowsiness. Do not combine Tylenol and Percocet (or similar) within a 6 hour period as Percocet (and similar) contain(s) Tylenol. If you do not need the narcotic pain medication, you do not need to fill the prescription.  Call office [phone]) at any time if any questions, worsening pain, fevers/chills, bleeding, drainage from incision site, or other concerns.

## 2022-06-08 NOTE — Discharge Summary (Signed)
[facility] SURGICAL DISCHARGE SUMMARY  Patient ID: Colleen Nicholson MRN: [id number] DOB/AGE: 01-10-1996 26 y.o.  Admit date: 06/06/2022 Discharge date: 06/08/2022  Discharge Diagnoses Patient Active Problem List   Diagnosis Date Noted   Acute appendicitis 06/06/2022    Consultants None  Procedures 06/07/2022:  Laparoscopic Appendectomy   HPI: 26 y.o. female presented to [facility] ED last night for abdominal pain. Patient reports the acute onset of sharp RLQ abdominal pain last night around 630 PM. This was constant in nature and worsened with movement. She reports associated nausea and diarrhea with he pain. No fever, chills, CP, SOB, emesis, or urinary changes. No history of similar. Only previous abdominal surgery is C-section x1. Work up in the ED revealed a leukocytosis to 15.1K, CMP was reassuring, and CT Abdomen/Pelvis was concerning for acute appendicitis.   Hospital Course: Informed consent was obtained and documented, and patient underwent uneventful laparoscopic appendectomy (Dr Aleen Campi, 06/07/2022).  Post-operatively, patient did well. Advancement of patient's diet and ambulation were well-tolerated. The remainder of patient's hospital course was essentially unremarkable, and discharge planning was initiated accordingly with patient safely able to be discharged home with appropriate discharge instructions, antibiotics (Augmentin x7 days), pain control, and outpatient follow-up after all of her questions were answered to her expressed satisfaction.   Discharge Condition: Good    Physical Examination:  Constitutional: Well appearing female, NAD Pulmonary: Normal effort, no respiratory distress Gastrointestinal: Soft, incisional soreness, non-distended, no rebound/guarding. Surgical drain in left abdomen; output serosanguinous Skin: Laparoscopic incisions are CDI with dermabond    Allergies as of 06/08/2022   No Known Allergies      Medication List      TAKE these medications    amoxicillin-clavulanate 875-125 MG tablet Commonly known as: AUGMENTIN Take 1 tablet by mouth 2 (two) times daily for 7 days.   ibuprofen 600 MG tablet Commonly known as: ADVIL Take 1 tablet (600 mg total) by mouth every 6 (six) hours as needed.   NEXPLANON [place] Inject into the skin. 03/09/20 03/10/17   ondansetron 4 MG disintegrating tablet Commonly known as: ZOFRAN-ODT Take 1 tablet (4 mg total) by mouth every 6 (six) hours as needed for nausea.   oxyCODONE 5 MG immediate release tablet Commonly known as: Oxy IR/ROXICODONE Take 1 tablet (5 mg total) by mouth every 6 (six) hours as needed for severe pain or breakthrough pain.          Follow-up Information     Donovan Kail, PA-C. Schedule an appointment as soon as possible for a visit in 1 week(s).   Specialty: Physician Assistant Why: s/p lap appendectomy, has drain Contact information: [address] [place] [address] [phone]                  Time spent on discharge management including discussion of hospital course, clinical condition, outpatient instructions, prescriptions, and follow up with the patient and members of the medical team: >30 minutes  -- Lynden Oxford , PA-C [facility]  06/08/2022, 10:31 AM [phone] M-F: 7am - 4pm

## 2022-06-10 LAB — SURGICAL PATHOLOGY

## 2022-06-15 ENCOUNTER — Ambulatory Visit: Payer: Self-pay | Admitting: Nurse Practitioner

## 2022-06-15 ENCOUNTER — Ambulatory Visit (INDEPENDENT_AMBULATORY_CARE_PROVIDER_SITE_OTHER): Payer: Self-pay | Admitting: Surgery

## 2022-06-15 ENCOUNTER — Encounter: Payer: Self-pay | Admitting: Surgery

## 2022-06-15 VITALS — BP 132/85 | HR 98 | Temp 98.2°F | Wt 238.8 lb

## 2022-06-15 DIAGNOSIS — K3580 Unspecified acute appendicitis: Secondary | ICD-10-CM

## 2022-06-15 DIAGNOSIS — Z09 Encounter for follow-up examination after completed treatment for conditions other than malignant neoplasm: Secondary | ICD-10-CM

## 2022-06-15 NOTE — Progress Notes (Signed)
06/15/2022  HPI: Colleen Nicholson is a 26 y.o. female s/p laparoscopic appendectomy on 06/07/2022.  Drain was left in place.  She presents today for follow-up.  Reports that pain has resolved and denies any complications.  Drain has been emptying about 20 cc of serous fluid  Vital signs: BP 132/85   Pulse 98   Temp 98.2 F (36.8 C) (Oral)   Wt 238 lb 12.8 oz (108.3 kg)   LMP  (LMP Unknown)   SpO2 98%   BMI 39.74 kg/m    Physical Exam: Constitutional: No acute distress Abdomen: Soft, nondistended, nontender to palpation.  Incisions are healing well and are clean, dry, intact without any evidence of infection.  Drain in place with serosanguineous fluid.  Drain was removed without any complications and dry gauze dressing applied.  Assessment/Plan: This is a 26 y.o. female s/p laparoscopic appendectomy  - Patient is healing well without any complications. - Drain was removed today without any issues.  Dry gauze dressing applied and instructed on daily dressing changes until the wound is fully healed. - Follow-up as needed.   Howie Ill, MD [facility]

## 2022-06-15 NOTE — Patient Instructions (Signed)
If you have any concerns or questions, please feel free to call our office. Follow up as needed.    GENERAL POST-OPERATIVE PATIENT INSTRUCTIONS   WOUND CARE INSTRUCTIONS:  Keep a dry clean dressing on the wound if there is drainage. The initial bandage may be removed after 24 hours.  Once the wound has quit draining you may leave it open to air.  If clothing rubs against the wound or causes irritation and the wound is not draining you may cover it with a dry dressing during the daytime.  Try to keep the wound dry and avoid ointments on the wound unless directed to do so.  If the wound becomes bright red and painful or starts to drain infected material that is not clear, please contact your physician immediately.  If the wound is mildly pink and has a thick firm ridge underneath it, this is normal, and is referred to as a healing ridge.  This will resolve over the next 4-6 weeks.  BATHING: You may shower if you have been informed of this by your surgeon. However, Please do not submerge in a tub, hot tub, or pool until incisions are completely sealed or have been told by your surgeon that you may do so.  DIET:  You may eat any foods that you can tolerate.  It is a good idea to eat a high fiber diet and take in plenty of fluids to prevent constipation.  If you do become constipated you may want to take a mild laxative or take ducolax tablets on a daily basis until your bowel habits are regular.  Constipation can be very uncomfortable, along with straining, after recent surgery.  ACTIVITY:  You are encouraged to cough and deep breath or use your incentive spirometer if you were given one, every 15-30 minutes when awake.  This will help prevent respiratory complications and low grade fevers post-operatively if you had a general anesthetic.  You may want to hug a pillow when coughing and sneezing to add additional support to the surgical area, if you had abdominal or chest surgery, which will decrease pain  during these times.  You are encouraged to walk and engage in light activity for the next two weeks.  You should not lift more than 20 pounds for 6 weeks total after surgery as it could put you at increased risk for complications.  Twenty pounds is roughly equivalent to a plastic bag of groceries. At that time- Listen to your body when lifting, if you have pain when lifting, stop and then try again in a few days. Soreness after doing exercises or activities of daily living is normal as you get back in to your normal routine.  MEDICATIONS:  Try to take narcotic medications and anti-inflammatory medications, such as tylenol, ibuprofen, naprosyn, etc., with food.  This will minimize stomach upset from the medication.  Should you develop nausea and vomiting from the pain medication, or develop a rash, please discontinue the medication and contact your physician.  You should not drive, make important decisions, or operate machinery when taking narcotic pain medication.  SUNBLOCK Use sun block to incision area over the next year if this area will be exposed to sun. This helps decrease scarring and will allow you avoid a permanent darkened area over your incision.  QUESTIONS:  Please feel free to call our office if you have any questions, and we will be glad to assist you. [phone]   

## 2022-06-22 ENCOUNTER — Ambulatory Visit: Payer: Self-pay | Admitting: Nurse Practitioner

## 2022-06-27 ENCOUNTER — Ambulatory Visit (INDEPENDENT_AMBULATORY_CARE_PROVIDER_SITE_OTHER): Payer: 59 | Admitting: Nurse Practitioner

## 2022-06-27 ENCOUNTER — Encounter: Payer: Self-pay | Admitting: Nurse Practitioner

## 2022-06-27 VITALS — BP 130/72 | HR 86 | Temp 98.2°F | Resp 16 | Ht 65.0 in | Wt 240.5 lb

## 2022-06-27 DIAGNOSIS — Z09 Encounter for follow-up examination after completed treatment for conditions other than malignant neoplasm: Secondary | ICD-10-CM | POA: Diagnosis not present

## 2022-06-27 NOTE — Progress Notes (Signed)
   Established Patient Office Visit  Subjective   Patient ID: Colleen Nicholson, female    DOB: [birth year]  Age: 26 y.o. MRN: [id number]  Chief Complaint  Patient presents with   Hospitalization Follow-up    HPI  Hospital follow up: Patient was seen on 06/06/2022 in the ED. Labs and CT scan was done. CT showed concern for early appendicitis. Zosyn was given and surgery consulted. She was admitted.  Patient went under went a laparoscopic appendectomy on 06/07/2022.  She was discharged thereafter.  Patient did have follow-up with Dr. Aleen Campi, the surgeon, on 06/15/2022.  Patient had a drain removed at that juncture and incision sites were looking good.  No further follow-up needed from general surgery.  Patient is here today for follow-up.  States she is doing well.  Eating well without any complaints.  States very rare abdominal pain and that is with certain movements.  Patient's bowels seem to be normal for her and urinating normally.  Of note patient did have some questions about birth control.  Patient has a Nexplanon and currently is followed by GYN.  This is her second Nexplanon per her report.  States that her husband is going to be undergoing a vasectomy after the first of the year.  Patient states she did start menarche in the fifth grade so around age 22.  She has had painful and heavy periods per her report.  Did discuss the benefit of using birth control for contraception and period regulation.    Review of Systems  Constitutional:  Negative for chills and fever.  Respiratory:  Negative for shortness of breath.   Cardiovascular:  Negative for chest pain.  Gastrointestinal:  Negative for abdominal pain, constipation, diarrhea, nausea and vomiting.      Objective:     BP 130/72   Pulse 86   Temp 98.2 F (36.8 C)   Resp 16   Ht 5\' 5"  (1.651 m)   Wt 240 lb 8 oz (109.1 kg)   LMP  (LMP Unknown)   SpO2 98%   BMI 40.02 kg/m    Physical Exam Vitals and nursing note reviewed.   Constitutional:      Appearance: Normal appearance.  Cardiovascular:     Rate and Rhythm: Normal rate and regular rhythm.     Heart sounds: Normal heart sounds.  Pulmonary:     Effort: Pulmonary effort is normal.     Breath sounds: Normal breath sounds.  Abdominal:     General: Bowel sounds are normal.     Comments: Well healing surgical incision. Larposcopic   Neurological:     Mental Status: She is alert.      No results found for any visits on 06/27/22.    The ASCVD Risk score (Arnett DK, et al., 2019) failed to calculate for the following reasons:   The 2019 ASCVD risk score is only valid for ages 16 to 85    Assessment & Plan:   Problem List Items Addressed This Visit       Other   Hospital discharge follow-up - Primary    Did review CT scan labs and ED and inpatient notes.  Also reviewed last surgeon note patient is cleared from general surgery       Return in about 8 months (around 02/26/2023), or if symptoms worsen or fail to improve, for CPE and Labs.    04/28/2023, NP

## 2022-06-27 NOTE — Assessment & Plan Note (Signed)
Did review CT scan labs and ED and inpatient notes.  Also reviewed last surgeon note patient is cleared from general surgery

## 2022-06-27 NOTE — Assessment & Plan Note (Signed)
Patient had an appendectomy from general surgery and has followed up.  Doing well

## 2022-06-27 NOTE — Patient Instructions (Signed)
Nice to see you today Your incision from your surgery look good I dont need to see you until it is time for your physical in August of 2024.  If you need me before you can schedule an appointment  Schedule with your GYN to discuss your birth control

## 2022-07-13 ENCOUNTER — Emergency Department
Admission: EM | Admit: 2022-07-13 | Discharge: 2022-07-13 | Discharge status: Against Medical Advice | Payer: 59 | Attending: Emergency Medicine | Admitting: Emergency Medicine

## 2022-07-13 DIAGNOSIS — R142 Eructation: Secondary | ICD-10-CM | POA: Diagnosis not present

## 2022-07-13 DIAGNOSIS — R197 Diarrhea, unspecified: Secondary | ICD-10-CM | POA: Insufficient documentation

## 2022-07-13 DIAGNOSIS — Z5321 Procedure and treatment not carried out due to patient leaving prior to being seen by health care provider: Secondary | ICD-10-CM | POA: Insufficient documentation

## 2022-07-13 LAB — COMPREHENSIVE METABOLIC PANEL
ALT: 51 U/L — ABNORMAL HIGH (ref 0–44)
AST: 29 U/L (ref 15–41)
Albumin: 4.3 g/dL (ref 3.5–5.0)
Alkaline Phosphatase: 102 U/L (ref 38–126)
Anion gap: 7 (ref 5–15)
BUN: 9 mg/dL (ref 6–20)
CO2: 23 mmol/L (ref 22–32)
Calcium: 8.6 mg/dL — ABNORMAL LOW (ref 8.9–10.3)
Chloride: 110 mmol/L (ref 98–111)
Creatinine, Ser: 0.6 mg/dL (ref 0.44–1.00)
GFR, Estimated: >60 mL/min (ref >60)
Glucose, Bld: 81 mg/dL (ref 70–99)
Potassium: 3.4 mmol/L — ABNORMAL LOW (ref 3.5–5.1)
Sodium: 140 mmol/L (ref 135–145)
Total Bilirubin: 0.7 mg/dL (ref 0.3–1.2)
Total Protein: 7.6 g/dL (ref 6.5–8.1)

## 2022-07-13 LAB — CBC
HCT: 45.1 % (ref 36.0–46.0)
Hemoglobin: 15 g/dL (ref 12.0–15.0)
MCH: 30.9 pg (ref 26.0–34.0)
MCHC: 33.3 g/dL (ref 30.0–36.0)
MCV: 92.8 fL (ref 80.0–100.0)
Platelets: 404 10*3/uL — ABNORMAL HIGH (ref 150–400)
RBC: 4.86 MIL/uL (ref 3.87–5.11)
RDW: 12.4 % (ref 11.5–15.5)
WBC: 10.8 10*3/uL — ABNORMAL HIGH (ref 4.0–10.5)
nRBC: 0 % (ref 0.0–0.2)

## 2022-07-13 LAB — LIPASE, BLOOD: Lipase: 105 U/L — ABNORMAL HIGH (ref 11–51)

## 2022-07-13 NOTE — ED Triage Notes (Signed)
Pt presents to the ED via POV due to diarrhea. Pt states she has had diarrhea for 24 hrs. Pt had appendectomy on 11/21. Pt also c/o "eggy smelling burps". Pt denies N/V and minimal pain. Pt A&Ox4

## 2022-07-13 NOTE — ED Provider Triage Note (Signed)
Emergency Medicine Provider Triage Evaluation Note  Colleen Nicholson, a 26 y.o. female  was evaluated in triage.  Pt complains of diarrhea, and malodorous belching.  She reports diarrhea for the last 24 hours noting watery stools without blood or melena.  She had an appendectomy on 1121 without complication.  Patient denies any nausea, vomiting, fevers at this time.  Review of Systems  Positive: Abdominal pain, diarrhea Negative: FCS  Physical Exam  BP 120/76   Pulse 74   Temp 97.9 F (36.6 C) (Oral)   Resp 16   Ht 5\' 5"  (1.651 m)   Wt 109.9 kg   SpO2 100%   BMI 40.32 kg/m  Gen:   Awake, no distress   Resp:  Normal effort  MSK:   Moves extremities without difficulty  Other:    Medical Decision Making  Medically screening exam initiated at 5:32 PM.  Appropriate orders placed.  Colleen Nicholson was informed that the remainder of the evaluation will be completed by another provider, this initial triage assessment does not replace that evaluation, and the importance of remaining in the ED until their evaluation is complete.  Patient to the ED for evaluation 24 hours of watery stools and malodorous belching.   [name], PA-C 07/13/22 1733

## 2022-07-19 ENCOUNTER — Telehealth: Payer: Self-pay | Admitting: Emergency Medicine

## 2022-07-19 NOTE — Telephone Encounter (Signed)
Called patient due to left emergency department before provider exam to inquire about condition and follow up plans. Left message. 

## 2022-10-25 ENCOUNTER — Ambulatory Visit
Admission: RE | Admit: 2022-10-25 | Discharge: 2022-10-25 | Disposition: A | Discharge status: Home / Self Care | Payer: Medicaid Other | Source: Ambulatory Visit | Attending: Urgent Care | Admitting: Urgent Care

## 2022-10-25 VITALS — BP 110/79 | HR 91 | Temp 98.9°F | Resp 18

## 2022-10-25 DIAGNOSIS — N898 Other specified noninflammatory disorders of vagina: Secondary | ICD-10-CM | POA: Insufficient documentation

## 2022-10-25 NOTE — Discharge Instructions (Addendum)
Follow up here or with your primary care provider if your symptoms are worsening or not improving.     

## 2022-10-25 NOTE — ED Provider Notes (Signed)
Colleen Nicholson    CSN: [id number] Arrival date & time: 10/25/22  1712      History   Chief Complaint Chief Complaint  Patient presents with   Vaginal Discharge    HPI Colleen Nicholson is a 27 y.o. female.    Vaginal Discharge   Patient presents to urgent care with complaint of "fishy" vaginal odor starting 2 days ago.  She also reports a "small red bump" on the left side of her labia starting 3 days ago.  Past Medical History:  Diagnosis Date   Amenorrhea    History of gestational hypertension 02/11/2016   Baseline P:C ratio 59   History of placenta abruption 02/11/2016   History of preterm delivery, currently pregnant in second trimester 02/21/2016   Obesity    VBAC (vaginal birth after Cesarean)     Patient Active Problem List   Diagnosis Date Noted   Hospital discharge follow-up 06/27/2022   Acute appendicitis 06/06/2022   Preventative health care 02/15/2022   Other fatigue 02/15/2022   Food insecurity 02/15/2022   Prediabetes 05/12/2020   Hyperlipidemia 05/12/2020   ?Hypertriglyceridemia 05/12/2020   Elevated ALT measurement 05/12/2020   Morbid obesity 07/13/2016   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 Authorizing Provider  Etonogestrel (NEXPLANON [place]) Inject into the skin. 03/09/20 03/10/17    [provider]  ibuprofen  (ADVIL) 600 MG tablet Take 1 tablet (600 mg total) by mouth every 6 (six) hours as needed. Patient not taking: Reported on 06/27/2022 06/08/22   Henrene Dodge, MD  ondansetron (ZOFRAN-ODT) 4 MG disintegrating tablet Take 1 tablet (4 mg total) by mouth every 6 (six) hours as needed for nausea. Patient not taking: Reported on 06/27/2022 06/08/22   Henrene Dodge, MD    Family History Family History  Problem Relation Age of Onset   COPD Mother    Asthma Mother    Fibromyalgia Maternal Grandmother    Healthy Half-Brother     Social History Social History   Tobacco Use   Smoking status: Former    Packs/day: 0.50    Years: 5.00    Additional pack years: 0.00    Total pack years: 2.50    Types: Cigarettes    Quit date: 10/17/2019    Years since quitting: 3.0    Passive exposure: Past   Smokeless tobacco: Never  Vaping Use   Vaping Use: Every day   Substances: Nicotine, Flavoring  Substance Use Topics   Alcohol use: No    Alcohol/week: 0.0 standard drinks of alcohol  Drug use: No     Allergies   Patient has no known allergies.   Review of Systems Review of Systems  Genitourinary:  Positive for vaginal discharge.     Physical Exam Triage Vital Signs ED Triage Vitals  Enc Vitals Group     BP 10/25/22 1804 110/79     Pulse Rate 10/25/22 1804 91     Resp 10/25/22 1804 18     Temp 10/25/22 1804 98.9 F (37.2 C)     Temp Source 10/25/22 1804 Oral     SpO2 10/25/22 1804 100 %     Weight --      Height --      Head Circumference --      Peak Flow --      Pain Score 10/25/22 1808 0     Pain Loc --      Pain Edu? --      Excl. in GC? --    No data found.  Updated Vital Signs BP 110/79 (BP Location: Left Arm)   Pulse 91   Temp 98.9 F (37.2 C) (Oral)   Resp 18   SpO2 100%   Visual Acuity Right Eye Distance:   Left Eye Distance:   Bilateral Distance:    Right Eye Near:   Left Eye Near:    Bilateral Near:     Physical Exam Vitals reviewed.   Constitutional:      Appearance: Normal appearance.  Skin:    General: Skin is warm and dry.  Neurological:     General: No focal deficit present.     Mental Status: She is alert and oriented to person, place, and time.  Psychiatric:        Mood and Affect: Mood normal.        Behavior: Behavior normal.      UC Treatments / Results  Labs (all labs ordered are listed, but only abnormal results are displayed) Labs Reviewed - No data to display  EKG   Radiology No results found.  Procedures Procedures (including critical care time)  Medications Ordered in UC Medications - No data to display  Initial Impression / Assessment and Plan / UC Course  I have reviewed the triage vital signs and the nursing notes.  Pertinent labs & imaging results that were available during my care of the patient were reviewed by me and considered in my medical decision making (see chart for details).   Patient declining GU exam.  Recommended use of Epsom salt compresses on the "pimple" with gentle massage based on her description.  Vaginal swab is obtained and results are pending.   Final Clinical Impressions(s) / UC Diagnoses   Final diagnoses:  None   Discharge Instructions   None    ED Prescriptions   None    PDMP not reviewed this encounter.   Charma Igo, [place] 10/25/22 1824

## 2022-10-25 NOTE — ED Triage Notes (Signed)
Pt states she is having fishy vaginal odor that started 2 days ago. Pt states she also has a small red bump on left labia that started 3 days ago.

## 2022-10-26 ENCOUNTER — Telehealth: Payer: Self-pay | Admitting: Emergency Medicine

## 2022-10-26 ENCOUNTER — Ambulatory Visit
Admission: RE | Admit: 2022-10-26 | Discharge: 2022-10-26 | Disposition: A | Discharge status: Home / Self Care | Payer: Medicaid Other | Source: Ambulatory Visit | Attending: Nurse Practitioner | Admitting: Nurse Practitioner

## 2022-10-26 ENCOUNTER — Other Ambulatory Visit: Payer: Self-pay

## 2022-10-26 VITALS — BP 109/72 | HR 98 | Temp 98.2°F | Resp 18

## 2022-10-26 DIAGNOSIS — H9201 Otalgia, right ear: Secondary | ICD-10-CM | POA: Diagnosis not present

## 2022-10-26 DIAGNOSIS — H6991 Unspecified Eustachian tube disorder, right ear: Secondary | ICD-10-CM

## 2022-10-26 LAB — CERVICOVAGINAL ANCILLARY ONLY
Bacterial Vaginitis (gardnerella): POSITIVE — AB
Candida Glabrata: NEGATIVE
Candida Vaginitis: NEGATIVE
Chlamydia: NEGATIVE
Comment: NEGATIVE
Comment: NEGATIVE
Comment: NEGATIVE
Comment: NEGATIVE
Comment: NEGATIVE
Comment: NORMAL
Neisseria Gonorrhea: NEGATIVE
Trichomonas: NEGATIVE

## 2022-10-26 MED ORDER — METRONIDAZOLE 500 MG PO TABS: 500.0000 mg | ORAL_TABLET | Freq: Two times a day (BID) | ORAL | 0 refills | Status: DC

## 2022-10-26 NOTE — ED Triage Notes (Signed)
Patient presents to [facility] for evaluation after waking up from a nap with right ear pain, denies drainage, states she can feel fluid, muffled sounding

## 2022-10-26 NOTE — Discharge Instructions (Addendum)
Take plain Mucinex, ibuprofen, and Flonase nasal spray as directed.  Follow up with your primary care provider if your symptoms are not improving.    

## 2022-10-26 NOTE — ED Provider Notes (Signed)
[name]    CSN: [id number] Arrival date & time: 10/26/22  1637      History   Chief Complaint Chief Complaint  Patient presents with   Ear Fullness    Getting an ear infection. - Entered by patient   Ear Pain    HPI Colleen Nicholson is a 27 y.o. female.  Patient presents with right ear pain today.  No OTC medications taken.  She denies fever, chills, rash, ear drainage, sore throat, cough, shortness of breath, or other symptoms.  She was seen at this urgent care yesterday for vaginal odor and her cytology is positive for bacterial vaginitis; Metronidazole was called and for her today.  The history is provided by the patient and medical records.    Past Medical History:  Diagnosis Date   Amenorrhea    History of gestational hypertension 02/11/2016   Baseline P:C ratio 59   History of placenta abruption 02/11/2016   History of preterm delivery, currently pregnant in second trimester 02/21/2016   Obesity    VBAC (vaginal birth after Cesarean)     Patient Active Problem List   Diagnosis Date Noted   Hospital discharge follow-up 06/27/2022   Acute appendicitis 06/06/2022   Preventative health care 02/15/2022   Other fatigue 02/15/2022   Food insecurity 02/15/2022   Prediabetes 05/12/2020   Hyperlipidemia 05/12/2020   ?Hypertriglyceridemia 05/12/2020   Elevated ALT measurement 05/12/2020   Morbid obesity 07/13/2016   Vapes nicotine containing substance 07/21/2015    Past Surgical History:  Procedure Laterality Date   CESAREAN SECTION     INSERTION OF IMPLANON ROD  03/09/2020       LAPAROSCOPIC APPENDECTOMY N/A 06/07/2022   Procedure: APPENDECTOMY LAPAROSCOPIC;  Surgeon: Henrene Dodge, MD;  Location: [facility] ORS;  Service: General;  Laterality: N/A;   REMOVAL OF IMPLANON ROD  03/09/2020        OB History     Gravida  2   Para  2   Term  1   Preterm  1   AB      Living  2      SAB      IAB      Ectopic      Multiple  0   Live Births  2             Home Medications    Prior to Admission medications   Medication Sig Start Date End Date Taking? Authorizing Provider  Etonogestrel (NEXPLANON [place]) Inject into the skin. 03/09/20 03/10/17    [provider]  ibuprofen (ADVIL) 600 MG tablet Take 1 tablet (600 mg total) by mouth every 6 (six) hours as needed. Patient not taking: Reported on 06/27/2022 06/08/22   Henrene Dodge, MD  metroNIDAZOLE (FLAGYL) 500 MG tablet Take 1 tablet (500 mg total) by mouth 2 (two) times daily. 10/26/22   Lamptey, Britta Mccreedy, MD  ondansetron (ZOFRAN-ODT) 4 MG disintegrating tablet Take 1 tablet (4 mg total) by mouth every 6 (six) hours as needed for nausea. Patient not taking: Reported on 06/27/2022 06/08/22   Henrene Dodge, MD    Family History Family History  Problem Relation Age of Onset   COPD Mother    Asthma Mother    Fibromyalgia Maternal Grandmother    Healthy Half-Brother     Social History Social History   Tobacco Use   Smoking status: Former    Packs/day: 0.50    Years: 5.00    Additional pack years: 0.00  Total pack years: 2.50    Types: Cigarettes    Quit date: 10/17/2019    Years since quitting: 3.0    Passive exposure: Past   Smokeless tobacco: Never  Vaping Use   Vaping Use: Every day   Substances: Nicotine, Flavoring  Substance Use Topics   Alcohol use: No    Alcohol/week: 0.0 standard drinks of alcohol   Drug use: No     Allergies   Patient has no known allergies.   Review of Systems Review of Systems  Constitutional:  Negative for chills and fever.  HENT:  Positive for ear pain. Negative for ear discharge and sore throat.   Respiratory:  Negative for cough and shortness of breath.   Cardiovascular:  Negative for chest pain and palpitations.  Gastrointestinal:  Negative for diarrhea and vomiting.  All other systems reviewed and are negative.    Physical Exam Triage Vital Signs ED Triage Vitals  Enc Vitals Group     BP 10/26/22 1649  109/72     Pulse Rate 10/26/22 1649 98     Resp 10/26/22 1649 18     Temp --      Temp src --      SpO2 10/26/22 1649 97 %     Weight --      Height --      Head Circumference --      Peak Flow --      Pain Score 10/26/22 1648 7     Pain Loc --      Pain Edu? --      Excl. in GC? --    No data found.  Updated Vital Signs BP 109/72 (BP Location: Left Arm)   Pulse 98   Temp 98.2 F (36.8 C) (Oral)   Resp 18   SpO2 97%   Visual Acuity Right Eye Distance:   Left Eye Distance:   Bilateral Distance:    Right Eye Near:   Left Eye Near:    Bilateral Near:     Physical Exam Vitals and nursing note reviewed.  Constitutional:      General: She is not in acute distress.    Appearance: Normal appearance. She is well-developed. She is not ill-appearing.  HENT:     Right Ear: Tympanic membrane and ear canal normal.     Left Ear: Tympanic membrane and ear canal normal.     Nose: Nose normal.     Mouth/Throat:     Mouth: Mucous membranes are moist.     Pharynx: Oropharynx is clear.  Cardiovascular:     Rate and Rhythm: Normal rate and regular rhythm.     Heart sounds: Normal heart sounds.  Pulmonary:     Effort: Pulmonary effort is normal. No respiratory distress.     Breath sounds: Normal breath sounds.  Musculoskeletal:     Cervical back: Neck supple.  Skin:    General: Skin is warm and dry.  Neurological:     Mental Status: She is alert.  Psychiatric:        Mood and Affect: Mood normal.        Behavior: Behavior normal.      UC Treatments / Results  Labs (all labs ordered are listed, but only abnormal results are displayed) Labs Reviewed - No data to display  EKG   Radiology No results found.  Procedures Procedures (including critical care time)  Medications Ordered in UC Medications - No data to display  Initial Impression / Assessment  and Plan / UC Course  I have reviewed the triage vital signs and the nursing notes.  Pertinent labs & imaging  results that were available during my care of the patient were reviewed by me and considered in my medical decision making (see chart for details).    Right otalgia and eustachian tube dysfunction.  Afebrile, VSS.  Discussed symptomatic treatment including ibuprofen, plain Mucinex, Flonase.  Instructed patient to follow up with her PCP if symptoms are not improving.  Education provided on earache and eustachian tube dysfunction.  She agrees to plan of care.   Final Clinical Impressions(s) / UC Diagnoses   Final diagnoses:  Acute otalgia, right  Acute dysfunction of right eustachian tube     Discharge Instructions      Take plain Mucinex, ibuprofen, and Flonase nasal spray as directed.  Follow up with your primary care provider if your symptoms are not improving.        ED Prescriptions   None    PDMP not reviewed this encounter.   Mickie Bailate, Kindell Strada H, NP 10/26/22 [phone]

## 2022-10-30 ENCOUNTER — Emergency Department: Payer: Medicaid Other

## 2022-10-30 ENCOUNTER — Other Ambulatory Visit: Payer: Self-pay

## 2022-10-30 ENCOUNTER — Emergency Department
Admission: EM | Admit: 2022-10-30 | Discharge: 2022-10-30 | Disposition: A | Discharge status: Home / Self Care | Payer: Medicaid Other | Attending: Emergency Medicine | Admitting: Emergency Medicine

## 2022-10-30 DIAGNOSIS — H9201 Otalgia, right ear: Secondary | ICD-10-CM | POA: Diagnosis not present

## 2022-10-30 DIAGNOSIS — R0789 Other chest pain: Secondary | ICD-10-CM | POA: Diagnosis not present

## 2022-10-30 DIAGNOSIS — R051 Acute cough: Secondary | ICD-10-CM | POA: Insufficient documentation

## 2022-10-30 DIAGNOSIS — Z1152 Encounter for screening for COVID-19: Secondary | ICD-10-CM | POA: Insufficient documentation

## 2022-10-30 LAB — SARS CORONAVIRUS 2 BY RT PCR: SARS Coronavirus 2 by RT PCR: NEGATIVE

## 2022-10-30 MED ORDER — BENZONATATE 100 MG PO CAPS: 100.0000 mg | ORAL_CAPSULE | Freq: Three times a day (TID) | ORAL | 0 refills | Status: AC | PRN

## 2022-10-30 NOTE — ED Provider Notes (Signed)
[facility] Provider Note  Patient Contact: 10:57 PM (approximate)   History   Cough   HPI  Colleen Nicholson is a 27 y.o. female presents to the emergency department with cough for the past 2 to 3 days, right-sided ear pain and nasal congestion.  Patient reports she has some occasional chest discomfort with cough.  No nausea, vomiting or abdominal pain.      Physical Exam   Triage Vital Signs: ED Triage Vitals  Enc Vitals Group     BP 10/30/22 2202 (!) 153/85     Pulse --      Resp 10/30/22 2202 18     Temp 10/30/22 2202 98.2 F (36.8 C)     Temp Source 10/30/22 2202 Oral     SpO2 10/30/22 2202 96 %     Weight 10/30/22 2203 250 lb (113.4 kg)     Height 10/30/22 2203  (1.651 m)     Head Circumference --      Peak Flow --      Pain Score 10/30/22 2202 7     Pain Loc --      Pain Edu? --      Excl. in GC? --     Most recent vital signs: Vitals:   10/30/22 2202  BP: (!) 153/85  Resp: 18  Temp: 98.2 F (36.8 C)  SpO2: 96%    Constitutional: Alert and oriented. Patient is lying supine. Eyes: Conjunctivae are normal. PERRL. EOMI. Head: Atraumatic. ENT:      Ears: Tympanic membranes are mildly injected with mild effusion bilaterally.       Nose: No congestion/rhinnorhea.      Mouth/Throat: Mucous membranes are moist. Posterior pharynx is mildly erythematous.  Hematological/Lymphatic/Immunilogical: No cervical lymphadenopathy.  Cardiovascular: Normal rate, regular rhythm. Normal S1 and S2.  Good peripheral circulation. Respiratory: Normal respiratory effort without tachypnea or retractions. Lungs CTAB. Good air entry to the bases with no decreased or absent breath sounds. Gastrointestinal: Bowel sounds 4 quadrants. Soft and nontender to palpation. No guarding or rigidity. No palpable masses. No distention. No CVA tenderness. Musculoskeletal: Full range of motion to all extremities. No gross deformities appreciated. Neurologic:  Normal  speech and language. No gross focal neurologic deficits are appreciated.  Skin:  Skin is warm, dry and intact. No rash noted. Psychiatric: Mood and affect are normal. Speech and behavior are normal. Patient exhibits appropriate insight and judgement.   ED Results / Procedures / Treatments   Labs (all labs ordered are listed, but only abnormal results are displayed) Labs Reviewed  SARS CORONAVIRUS 2 BY RT PCR        RADIOLOGY  I personally viewed and evaluated these images as part of my medical decision making, as well as reviewing the written report by the radiologist.  ED Provider Interpretation: No acute abnormality on chest x-ray.    PROCEDURES:  Critical Care performed: No  Procedures   MEDICATIONS ORDERED IN ED: Medications - No data to display   IMPRESSION / MDM / ASSESSMENT AND PLAN / ED COURSE  I reviewed the triage vital signs and the nursing notes.                              Assessment and plan Cough 27 year old female presents to the emergency department with symptoms detailed in HPI.  Patient was hypertensive at triage but vital signs otherwise reassuring.  On exam, patient was  alert and nontoxic-appearing.  Chest x-ray shows no acute abnormality.  Patient was COVID-negative.  She was discharged with Jerilynn Som for cough.  Return precautions were given to return with new or worsening symptoms.     FINAL CLINICAL IMPRESSION(S) / ED DIAGNOSES   Final diagnoses:  Acute cough     Rx / DC Orders   ED Discharge Orders          Ordered    benzonatate (TESSALON PERLES) 100 MG capsule  3 times daily PRN        10/30/22 2253             Note:  This document was prepared using Dragon voice recognition software and may include unintentional dictation errors.   Pia Mau [place], Cordelia Poche 10/30/22 2258    Minna Antis, MD 10/30/22 2311

## 2022-10-30 NOTE — ED Triage Notes (Signed)
Pt has had cough, congestion, and headache x 2-3 days.

## 2022-10-31 ENCOUNTER — Telehealth: Payer: Self-pay

## 2022-10-31 NOTE — Transitions of Care (Post Inpatient/ED Visit) (Signed)
   10/31/2022  Name: RAIZEL PERNA MRN: [id number] DOB: 12/27/1995  Today's TOC FU Call Status: Today's TOC FU Call Status:: Unsuccessul Call (1st Attempt) Unsuccessful Call (1st Attempt) Date: 10/31/22  Attempted to reach the patient regarding the most recent Inpatient/ED visit.  Follow Up Plan: Additional outreach attempts will be made to reach the patient to complete the Transitions of Care (Post Inpatient/ED visit) call.   Signature Elisha Ponder LPN [facility] AWV Team Direct dial:  [phone]

## 2022-11-01 NOTE — Telephone Encounter (Signed)
Pt called back returning Joellen's call. Call back # [phone]

## 2022-11-01 NOTE — Transitions of Care (Post Inpatient/ED Visit) (Signed)
   11/01/2022  Name: Colleen Nicholson MRN: [id number] DOB: February 14, 1996  Today's TOC FU Call Status: Today's TOC FU Call Status:: Unsuccessful Call (2nd Attempt) Unsuccessful Call (1st Attempt) Date: 10/31/22 Unsuccessful Call (2nd Attempt) Date: 11/01/22  Attempted to reach the patient regarding the most recent Inpatient/ED visit.  Follow Up Plan: Additional outreach attempts will be made to reach the patient to complete the Transitions of Care (Post Inpatient/ED visit) call.   Signature  Donnamarie Poag, CMA

## 2022-11-02 NOTE — Transitions of Care (Post Inpatient/ED Visit) (Addendum)
Patient declined to set up follow up visit at this time. Confirmed she has contact information for our office if any new symptoms or questions.      11/02/2022  Name: Colleen Nicholson MRN: [id number] DOB: Nov 25, 1995  Today's TOC FU Call Status: Today's TOC FU Call Status:: Successful TOC FU Call Competed Unsuccessful Call (1st Attempt) Date: 10/31/22 Unsuccessful Call (2nd Attempt) Date: 11/01/22 [facility] FU Call Complete Date: 11/02/22  Transition Care Management Follow-up Telephone Call Date of Discharge: 10/30/22 Discharge Facility[facility]) Type of Discharge: Emergency Department Reason for ED Visit: Respiratory (Acute cough) Respiratory Diagnosis:  (Acute cough) How have you been since you were released from the hospital?: Better Any questions or concerns?: No  Items Reviewed: Did you receive and understand the discharge instructions provided?: Yes Medications obtained and verified?: Yes (Medications Reviewed) Any new allergies since your discharge?: No Dietary orders reviewed?: NA Do you have support at home?: Yes People in Home: spouse Name of Support/Comfort Primary Source: [facility] and Equipment/Supplies: Were Home Health Services Ordered?: NA Any new equipment or medical supplies ordered?: NA  Functional Questionnaire: Do you need assistance with bathing/showering or dressing?: No Do you need assistance with meal preparation?: No Do you need assistance with eating?: No Do you have difficulty maintaining continence: No Do you need assistance with getting out of bed/getting out of a chair/moving?: No Do you have difficulty managing or taking your medications?: No  Follow up appointments reviewed: PCP Follow-up appointment confirmed?: No MD Provider Line [phone] Given: Yes Specialist Hospital Follow-up appointment confirmed?: NA Do you need transportation to your follow-up appointment?: No Do you understand care  options if your condition(s) worsen?: Yes-patient verbalized understanding    SIGNATURE Donnamarie Poag, CMA

## 2022-11-06 ENCOUNTER — Ambulatory Visit
Admission: RE | Admit: 2022-11-06 | Discharge: 2022-11-06 | Disposition: A | Discharge status: Home / Self Care | Payer: Medicaid Other | Source: Ambulatory Visit | Attending: Emergency Medicine | Admitting: Emergency Medicine

## 2022-11-06 VITALS — BP 120/81 | HR 98 | Temp 97.8°F | Resp 18

## 2022-11-06 DIAGNOSIS — R300 Dysuria: Secondary | ICD-10-CM | POA: Insufficient documentation

## 2022-11-06 DIAGNOSIS — Z3202 Encounter for pregnancy test, result negative: Secondary | ICD-10-CM | POA: Diagnosis present

## 2022-11-06 LAB — POCT URINALYSIS DIP (MANUAL ENTRY)
Bilirubin, UA: NEGATIVE
Glucose, UA: NEGATIVE mg/dL
Ketones, POC UA: NEGATIVE mg/dL
Nitrite, UA: NEGATIVE
Protein Ur, POC: NEGATIVE mg/dL
Spec Grav, UA: 1.025 (ref 1.010–1.025)
Urobilinogen, UA: 0.2 E.U./dL
pH, UA: 6 (ref 5.0–8.0)

## 2022-11-06 LAB — POCT URINE PREGNANCY: Preg Test, Ur: NEGATIVE

## 2022-11-06 NOTE — ED Provider Notes (Signed)
Colleen Nicholson    CSN: [id number] Arrival date & time: 11/06/22  1312      History   Chief Complaint Chief Complaint  Patient presents with   Urinary Frequency    Possible uti - Entered by patient    HPI Colleen Nicholson is a 27 y.o. female.  Patient presents with dysuria, urinary frequency, urinary urgency x 2 days.  She denies fever, chills, abdominal pain, flank pain, vaginal discharge, pelvic pain, or other symptoms.  Patient was seen at [facility] ED on 10/30/2022; diagnosed with cough; chest x-ray negative; COVID-negative; treated with Tessalon Perles.  She was seen here on 10/26/2022; diagnosed with right otalgia and eustachian tube dysfunction; treated symptomatically.  She was seen at this urgent care on 10/25/2022; diagnosed with vaginal odor; positive for bacterial vaginitis; treated with metronidazole.  The history is provided by the patient and medical records.    Past Medical History:  Diagnosis Date   Amenorrhea    History of gestational hypertension 02/11/2016   Baseline P:C ratio 59   History of placenta abruption 02/11/2016   History of preterm delivery, currently pregnant in second trimester 02/21/2016   Obesity    VBAC (vaginal birth after Cesarean)     Patient Active Problem List   Diagnosis Date Noted   Hospital discharge follow-up 06/27/2022   Acute appendicitis 06/06/2022   Preventative health care 02/15/2022   Other fatigue 02/15/2022   Food insecurity 02/15/2022   Prediabetes 05/12/2020   Hyperlipidemia 05/12/2020   ?Hypertriglyceridemia 05/12/2020   Elevated ALT measurement 05/12/2020   Morbid obesity 07/13/2016   Vapes nicotine containing substance 07/21/2015    Past Surgical History:  Procedure Laterality Date   CESAREAN SECTION     INSERTION OF IMPLANON ROD  03/09/2020       LAPAROSCOPIC APPENDECTOMY N/A 06/07/2022   Procedure: APPENDECTOMY LAPAROSCOPIC;  Surgeon: Henrene Dodge, MD;  Location: [facility] ORS;  Service: General;  Laterality:  N/A;   REMOVAL OF IMPLANON ROD  03/09/2020        OB History     Gravida  2   Para  2   Term  1   Preterm  1   AB      Living  2      SAB      IAB      Ectopic      Multiple  0   Live Births  2            Home Medications    Prior to Admission medications   Medication Sig Start Date End Date Taking? Authorizing Provider  benzonatate (TESSALON PERLES) 100 MG capsule Take 1 capsule (100 mg total) by mouth 3 (three) times daily as needed for up to 7 days. Patient not taking: Reported on 11/06/2022 10/30/22 11/06/22  Pia Mau M, PA-C  Etonogestrel [facility]) Inject into the skin. 03/09/20 03/10/17    [provider]  ibuprofen (ADVIL) 600 MG tablet Take 1 tablet (600 mg total) by mouth every 6 (six) hours as needed. 06/08/22   Henrene Dodge, MD  metroNIDAZOLE (FLAGYL) 500 MG tablet Take 1 tablet (500 mg total) by mouth 2 (two) times daily. Patient not taking: Reported on 11/06/2022 10/26/22   Merrilee Jansky, MD  ondansetron (ZOFRAN-ODT) 4 MG disintegrating tablet Take 1 tablet (4 mg total) by mouth every 6 (six) hours as needed for nausea. 06/08/22   Henrene Dodge, MD    Family History Family History  Problem Relation Age of  Onset   COPD Mother    Asthma Mother    Fibromyalgia Maternal Grandmother    Healthy Half-Brother     Social History Social History   Tobacco Use   Smoking status: Former    Packs/day: 0.50    Years: 5.00    Additional pack years: 0.00    Total pack years: 2.50    Types: Cigarettes    Quit date: 10/17/2019    Years since quitting: 3.0    Passive exposure: Past   Smokeless tobacco: Never  Vaping Use   Vaping Use: Every day   Substances: Nicotine, Flavoring  Substance Use Topics   Alcohol use: No    Alcohol/week: 0.0 standard drinks of alcohol   Drug use: No     Allergies   Patient has no known allergies.   Review of Systems Review of Systems  Constitutional:  Negative for chills and fever.   Gastrointestinal:  Negative for abdominal pain, diarrhea, nausea and vomiting.  Genitourinary:  Positive for dysuria, frequency and urgency. Negative for flank pain, hematuria, pelvic pain and vaginal discharge.  Skin:  Negative for rash.  All other systems reviewed and are negative.    Physical Exam Triage Vital Signs ED Triage Vitals  Enc Vitals Group     BP 11/06/22 1338 120/81     Pulse Rate 11/06/22 1338 98     Resp 11/06/22 1338 18     Temp 11/06/22 1338 97.8 F (36.6 C)     Temp src --      SpO2 11/06/22 1338 98 %     Weight --      Height --      Head Circumference --      Peak Flow --      Pain Score 11/06/22 1336 0     Pain Loc --      Pain Edu? --      Excl. in GC? --    No data found.  Updated Vital Signs BP 120/81   Pulse 98   Temp 97.8 F (36.6 C)   Resp 18   SpO2 98%   Visual Acuity Right Eye Distance:   Left Eye Distance:   Bilateral Distance:    Right Eye Near:   Left Eye Near:    Bilateral Near:     Physical Exam Vitals and nursing note reviewed.  Constitutional:      General: She is not in acute distress.    Appearance: Normal appearance. She is well-developed. She is not ill-appearing.  HENT:     Mouth/Throat:     Mouth: Mucous membranes are moist.  Cardiovascular:     Rate and Rhythm: Normal rate and regular rhythm.     Heart sounds: Normal heart sounds.  Pulmonary:     Effort: Pulmonary effort is normal. No respiratory distress.     Breath sounds: Normal breath sounds.  Abdominal:     General: Bowel sounds are normal.     Palpations: Abdomen is soft.     Tenderness: There is no abdominal tenderness. There is no right CVA tenderness, left CVA tenderness, guarding or rebound.  Musculoskeletal:     Cervical back: Neck supple.  Skin:    General: Skin is warm and dry.  Neurological:     Mental Status: She is alert.  Psychiatric:        Mood and Affect: Mood normal.        Behavior: Behavior normal.      UC Treatments /  Results  Labs (all labs ordered are listed, but only abnormal results are displayed) Labs Reviewed  POCT URINALYSIS DIP (MANUAL ENTRY) - Abnormal; Notable for the following components:      Result Value   Clarity, UA cloudy (*)    Blood, UA trace-intact (*)    Leukocytes, UA Trace (*)    All other components within normal limits  URINE CULTURE  POCT URINE PREGNANCY    EKG   Radiology No results found.  Procedures Procedures (including critical care time)  Medications Ordered in UC Medications - No data to display  Initial Impression / Assessment and Plan / UC Course  I have reviewed the triage vital signs and the nursing notes.  Pertinent labs & imaging results that were available during my care of the patient were reviewed by me and considered in my medical decision making (see chart for details).    Dysuria, Negative pregnancy test.  Urine has trace leukocytes, trace blood.  Urine culture pending.  Discussed with patient we will call her if the culture shows need for treatment.  Education provided on dysuria.  Instructed her to follow-up with her PCP.  She agrees to plan of care.  Final Clinical Impressions(s) / UC Diagnoses   Final diagnoses:  Dysuria  Negative pregnancy test     Discharge Instructions      The urine culture is pending.  We will call you if it shows the need for treatment.    Follow up with your primary care provider if your symptoms are not improving.         ED Prescriptions   None    PDMP not reviewed this encounter.   Mickie Bail, NP 11/06/22 [phone]

## 2022-11-06 NOTE — ED Triage Notes (Signed)
Patient to Urgent Care with complaints of urinary frequency/ urgency. Symptoms started 1-2 days ago.   Reports recently finishing treatment for BV.

## 2022-11-06 NOTE — Discharge Instructions (Signed)
The urine culture is pending.  We will call you if it shows the need for treatment.  Follow up with your primary care provider if your symptoms are not improving.     

## 2022-11-07 LAB — URINE CULTURE: Culture: 30000 — AB

## 2022-12-14 ENCOUNTER — Encounter: Payer: Self-pay | Admitting: Nurse Practitioner

## 2022-12-14 ENCOUNTER — Ambulatory Visit: Payer: Medicaid Other | Admitting: Nurse Practitioner

## 2022-12-14 VITALS — BP 124/80 | HR 96 | Temp 99.2°F | Resp 16 | Ht 65.0 in | Wt 220.5 lb

## 2022-12-14 DIAGNOSIS — H6991 Unspecified Eustachian tube disorder, right ear: Secondary | ICD-10-CM

## 2022-12-14 DIAGNOSIS — N898 Other specified noninflammatory disorders of vagina: Secondary | ICD-10-CM | POA: Diagnosis not present

## 2022-12-14 DIAGNOSIS — R350 Frequency of micturition: Secondary | ICD-10-CM | POA: Diagnosis not present

## 2022-12-14 DIAGNOSIS — R8290 Unspecified abnormal findings in urine: Secondary | ICD-10-CM

## 2022-12-14 LAB — POC URINALSYSI DIPSTICK (AUTOMATED)
Bilirubin, UA: NEGATIVE
Blood, UA: NEGATIVE
Glucose, UA: NEGATIVE
Ketones, UA: POSITIVE
Nitrite, UA: NEGATIVE
Protein, UA: POSITIVE — AB
Spec Grav, UA: >=1.03 — AB (ref 1.010–1.025)
Urobilinogen, UA: NEGATIVE E.U./dL — AB
pH, UA: 6 (ref 5.0–8.0)

## 2022-12-14 NOTE — Assessment & Plan Note (Signed)
UA in office 

## 2022-12-14 NOTE — Progress Notes (Signed)
Acute Office Visit  Subjective:     Patient ID: Colleen Nicholson, female    DOB: 08-13-1995, 27 y.o.   MRN: [id number]  Chief Complaint  Patient presents with   Recurrent UTI    Just finished antibiotic yesterday and still taking flagyl.    HPI Patient is in today for urinary symptoms  Patient was recently seen at urgetn care. States that she was seen approx 1 week ago. I am unable to see the record. States that she got BV a month ago and got it cleared up. 1 month again BV and UTI. States that she was seen she was prescribed augmentin and flgyl. States that she finished augmentin yesterday. Has flagyl to go through Sunday.   States that she feels uncomfortanle vaginal. States that she has incomplete emptying. States irrattion with some itching. States that she is having clear watery discharge. Yesterday that she started spotting and has a nexplanon. States that she traditionally does not have a period.  Right ear: patient states that her right ear has been bothering her. States it happened th eday after she made the appointment. She has had eustachan tube dysfucntion in the past. She has used flonase, without good relief    Review of Systems  Constitutional:  Negative for chills and fever.  HENT:  Positive for ear pain. Negative for ear discharge.   Gastrointestinal:  Positive for abdominal pain (cramping). Negative for nausea and vomiting.  Genitourinary:        Mild itching.  Some clear discharge         Objective:    BP 124/80   Pulse 96   Temp 99.2 F (37.3 C)   Resp 16   Ht 5\' 5"  (1.651 m)   Wt 220 lb 8 oz (100 kg)   SpO2 98%   BMI 36.69 kg/m    Physical Exam Vitals and nursing note reviewed. Exam conducted with a chaperone present Tresa Endo Fiscal, CMA).  Constitutional:      Appearance: Normal appearance.  HENT:     Right Ear: Tympanic membrane, ear canal and external ear normal.     Left Ear: Tympanic membrane, ear canal and external ear normal.   Cardiovascular:     Rate and Rhythm: Normal rate and regular rhythm.     Heart sounds: Normal heart sounds.  Pulmonary:     Effort: Pulmonary effort is normal.     Breath sounds: Normal breath sounds.  Abdominal:     General: Bowel sounds are normal. There is no distension.     Palpations: There is no mass.     Tenderness: There is no abdominal tenderness. There is no right CVA tenderness or left CVA tenderness.     Hernia: No hernia is present.  Genitourinary:    Exam position: Lithotomy position.     Vagina: Tenderness and bleeding present.     Cervix: Normal.  Neurological:     Mental Status: She is alert.     Results for orders placed or performed in visit on 12/14/22  POCT Urinalysis Dipstick (Automated)  Result Value Ref Range   Color, UA Straw    Clarity, UA clear    Glucose, UA Negative Negative   Bilirubin, UA Negative    Ketones, UA Positive    Spec Grav, UA >=1.030 (A) 1.010 - 1.025   Blood, UA Negative    pH, UA 6.0 5.0 - 8.0   Protein, UA Positive (A) Negative   Urobilinogen, UA negative (A)  0.2 or 1.0 E.U./dL   Nitrite, UA Negative    Leukocytes, UA Small (1+) (A) Negative        Assessment & Plan:   Problem List Items Addressed This Visit       Nervous and Auditory   Eustachian tube disorder, right    History of the same.  Is using fluticasone.  Encourage patient use a second-generation antihistamine on top of the Flonase.        Other   Vaginal discharge    Pelvic exam done today.  Wet prep swab sent off pending result.  Patient does not have any concerns for STI or STD so that testing was not performed      Relevant Orders   WET PREP BY MOLECULAR PROBE   Abnormal urinalysis    Positive leukocytes will send off for urine culture since patient is having symptoms albeit she just finished a course of Augmentin.  Pending urine culture results      Relevant Orders   Urine Culture   Urine frequency - Primary    UA in office      Relevant  Orders   POCT Urinalysis Dipstick (Automated) (Completed)   Urine Culture    No orders of the defined types were placed in this encounter.   Return in about 3 months (around 03/16/2023) for CPE and Labs.  Audria Nine, NP

## 2022-12-14 NOTE — Assessment & Plan Note (Signed)
History of the same.  Is using fluticasone.  Encourage patient use a second-generation antihistamine on top of the Flonase.

## 2022-12-14 NOTE — Patient Instructions (Signed)
Nice to see you today Drink plenty of water Take the medication from the urgent care as prescribed Follow up with me in aporox 3 months for your physical and labs, sooner if you need me

## 2022-12-14 NOTE — Assessment & Plan Note (Signed)
Positive leukocytes will send off for urine culture since patient is having symptoms albeit she just finished a course of Augmentin.  Pending urine culture results

## 2022-12-14 NOTE — Assessment & Plan Note (Signed)
Pelvic exam done today.  Wet prep swab sent off pending result.  Patient does not have any concerns for STI or STD so that testing was not performed

## 2022-12-15 LAB — URINE CULTURE
MICRO NUMBER:: 15014605
Result:: NO GROWTH
SPECIMEN QUALITY:: ADEQUATE

## 2022-12-15 LAB — WET PREP BY MOLECULAR PROBE
Candida species: NOT DETECTED
Gardnerella vaginalis: NOT DETECTED
MICRO NUMBER:: 15014604
SPECIMEN QUALITY:: ADEQUATE
Trichomonas vaginosis: NOT DETECTED

## 2022-12-28 ENCOUNTER — Other Ambulatory Visit (HOSPITAL_COMMUNITY)
Admission: RE | Admit: 2022-12-28 | Discharge: 2022-12-28 | Disposition: A | Discharge status: Home / Self Care | Payer: Medicaid Other | Source: Ambulatory Visit | Attending: Nurse Practitioner | Admitting: Nurse Practitioner

## 2022-12-28 ENCOUNTER — Ambulatory Visit: Payer: Medicaid Other | Admitting: Nurse Practitioner

## 2022-12-28 ENCOUNTER — Encounter: Payer: Self-pay | Admitting: Nurse Practitioner

## 2022-12-28 VITALS — BP 116/76 | HR 80 | Temp 98.5°F | Resp 16 | Ht 65.0 in | Wt 221.4 lb

## 2022-12-28 DIAGNOSIS — N898 Other specified noninflammatory disorders of vagina: Secondary | ICD-10-CM

## 2022-12-28 MED ORDER — CLOBETASOL PROPIONATE 0.05 % EX CREA
1.0000 | TOPICAL_CREAM | Freq: Two times a day (BID) | CUTANEOUS | 0 refills | Status: DC
Start: 2022-12-28 — End: 2023-01-16

## 2022-12-28 NOTE — Patient Instructions (Signed)
Nice to see you today I will be in touch with the labs once I have reviewed them I have sent in the cream to use twice a day. No more than a week of continuous use, then as needed Keep the GYN appointment as scheduled

## 2022-12-28 NOTE — Assessment & Plan Note (Signed)
Urine cytology for STI.

## 2022-12-28 NOTE — Assessment & Plan Note (Signed)
Patient has been treated for BV many times along with urinary tract infection.  Did do a wet prep last time it was negative.  Will check STI panel today will prescribe clobetasol to use twice daily for no more than a week and then as needed thereafter.  Did review topical steroid precautions with patient.  Encouraged her to keep her follow-up with GYN as scheduled.  Patient can also use indirect ice to the vulva if beneficial

## 2022-12-28 NOTE — Progress Notes (Signed)
Acute Office Visit  Subjective:     Patient ID: LINZE TONTI, female    DOB: 1995-12-04, 27 y.o.   MRN: [id number]  Chief Complaint  Patient presents with   Vaginal Pain    Irritated and itching     Patient is in today for vaginal pain  Patinet was seen by me on 12/14/2022 for vaginal discharge and wet prep along with urine culture came back negative. She is scheduled to see GYn but cannot get in until July   States that she did finish the medication. States that she waited 5-6 days after she had sex. States that she  was inflammed bad. States that she does not use condoms during intercourse. No femeine product changes.   Review of Systems  Constitutional:  Negative for chills and fever.  Respiratory:  Negative for shortness of breath.   Cardiovascular:  Negative for chest pain.  Genitourinary:  Positive for vaginal pain. Negative for dysuria.       Vaginal itching/irritation and discharge "+"  Neurological:  Negative for headaches.  Psychiatric/Behavioral:  Negative for hallucinations and suicidal ideas.         Objective:    BP 116/76   Pulse 80   Temp 98.5 F (36.9 C)   Resp 16   Ht 5\' 5"  (1.651 m)   Wt 221 lb 6 oz (100.4 kg)   SpO2 99%   BMI 36.84 kg/m  BP Readings from Last 3 Encounters:  12/28/22 116/76  12/14/22 124/80  11/06/22 120/81   Wt Readings from Last 3 Encounters:  12/28/22 221 lb 6 oz (100.4 kg)  12/14/22 220 lb 8 oz (100 kg)  10/30/22 250 lb (113.4 kg)      Physical Exam Vitals and nursing note reviewed.  Constitutional:      Appearance: Normal appearance. She is obese.  Cardiovascular:     Rate and Rhythm: Normal rate and regular rhythm.     Heart sounds: Normal heart sounds.  Pulmonary:     Effort: Pulmonary effort is normal.     Breath sounds: Normal breath sounds.  Abdominal:     General: Bowel sounds are normal. There is no distension.     Palpations: There is no mass.     Tenderness: There is no abdominal tenderness.      Hernia: No hernia is present.  Neurological:     Mental Status: She is alert.     No results found for any visits on 12/28/22.      Assessment & Plan:   Problem List Items Addressed This Visit       Other   Vaginal discharge    Urine cytology for STI.      Relevant Orders   Urine cytology ancillary only   Vaginal irritation - Primary    Patient has been treated for BV many times along with urinary tract infection.  Did do a wet prep last time it was negative.  Will check STI panel today will prescribe clobetasol to use twice daily for no more than a week and then as needed thereafter.  Did review topical steroid precautions with patient.  Encouraged her to keep her follow-up with GYN as scheduled.  Patient can also use indirect ice to the vulva if beneficial      Relevant Medications   clobetasol cream (TEMOVATE) 0.05 %    Meds ordered this encounter  Medications   clobetasol cream (TEMOVATE) 0.05 %    Sig: Apply 1 Application topically  2 (two) times daily.    Dispense:  30 g    Refill:  0    Order Specific Question:   Supervising Provider    Answer:   Roxy Manns A [[id number]]    Return As scheduled, for CPE and Labs.  Audria Nine, NP

## 2022-12-29 LAB — URINE CYTOLOGY ANCILLARY ONLY
Chlamydia: NEGATIVE
Comment: NEGATIVE
Comment: NEGATIVE
Comment: NORMAL
Neisseria Gonorrhea: NEGATIVE
Trichomonas: NEGATIVE

## 2022-12-30 ENCOUNTER — Encounter: Payer: Self-pay | Admitting: Nurse Practitioner

## 2023-01-16 ENCOUNTER — Other Ambulatory Visit (HOSPITAL_COMMUNITY)
Admission: RE | Admit: 2023-01-16 | Discharge: 2023-01-16 | Disposition: A | Discharge status: Home / Self Care | Payer: Medicaid Other | Source: Ambulatory Visit | Attending: Obstetrics and Gynecology | Admitting: Obstetrics and Gynecology

## 2023-01-16 ENCOUNTER — Encounter: Payer: Self-pay | Admitting: Obstetrics and Gynecology

## 2023-01-16 ENCOUNTER — Ambulatory Visit: Payer: Medicaid Other | Admitting: Obstetrics and Gynecology

## 2023-01-16 VITALS — BP 129/91 | HR 79 | Wt 218.4 lb

## 2023-01-16 DIAGNOSIS — N760 Acute vaginitis: Secondary | ICD-10-CM

## 2023-01-16 DIAGNOSIS — N898 Other specified noninflammatory disorders of vagina: Secondary | ICD-10-CM

## 2023-01-16 DIAGNOSIS — Z3046 Encounter for surveillance of implantable subdermal contraceptive: Secondary | ICD-10-CM

## 2023-01-16 DIAGNOSIS — B9689 Other specified bacterial agents as the cause of diseases classified elsewhere: Secondary | ICD-10-CM | POA: Diagnosis not present

## 2023-01-16 HISTORY — PX: REMOVAL OF IMPLANON ROD: OBO 1006

## 2023-01-16 NOTE — Patient Instructions (Addendum)
Kefir: drink a half cup every night before bed

## 2023-01-16 NOTE — Progress Notes (Unsigned)
  Obstetrics and Gynecology Visit Return Patient Evaluation  Appointment Date: 01/16/2023  Primary Care Provider: Eden Emms  OBGYN Clinic: [facility] for [facility]  Chief Complaint: vag discharge, BV and nexplanon removal  History of Present Illness:  Colleen Nicholson is a 27 y.o. with above CC  Nexplanon removal: due to come out in a few months. Pt amenorrheic on it (one day of bleeding last month) and husband has had a vasectomy with clear semen analysis. She is interested in removal of it and not getting another one  Vaginal discharge: had +BV swab in April, negative wet prep and urine cytology late May. She has s/s of discomfort and discharge. She states the flagyl did help. Neg pap 04/2020.   Review of Systems: r as noted in the History of Present Illness.  Medications:  [name] had no medications administered during this visit. Current Outpatient Medications  Medication Sig Dispense Refill   Etonogestrel (NEXPLANON ) Inject into the skin. 03/09/20 03/10/17     No current facility-administered medications for this visit.    Allergies: has No Known Allergies.  Physical Exam:  BP (!) 129/91   Pulse 79   Wt 218 lb 6.4 oz (99.1 kg)   BMI 36.34 kg/m  Body mass index is 36.34 kg/m. General appearance: Well nourished, well developed female in no acute distress.  Abdomen: diffusely non tender to palpation, non distended, and no masses, hernias Neuro/Psych:  Normal mood and affect.    Pelvic exam:  Cervical exam performed in the presence of a chaperone EGBUS: normal Vaginal vault: mild to moderate white, non malodorous discharge, no bleeding Cervix:  wnl Bimanual: negative  See procedure note for nexplanon removal details  Assessment: patient stable  Plan:  1. Vaginal discharge - Cervicovaginal ancillary only  2. Nexplanon removal F/u at annual in a few months to see how her periods are as she states they were somewhat heavy  pre-nexplanon. She wants removal and hormone free to see how she does w/o it. I also told her that nexplanon can increase cervical mucus production that could be causing the discharge  3. BV (bacterial vaginosis)   Return in about 3 months (around 04/18/2023) for in person.pap and f/u how her periods are off of nexplanon.   Future Appointments  Date Time Provider Department Center  04/10/2023 10:20 AM Eden Emms, NP [facility]    Cornelia Copa MD Attending Center for [facility])

## 2023-01-16 NOTE — Procedures (Signed)
Nexplanon Removal Procedure Note Husband had vasectomy and clear semen analysis and she desires Nexplanon removal.  Prior to the procedure being performed, the patient (or guardian) was asked to state their full name, date of birth, type of procedure being performed and the exact location of the operative site. This information was then checked against the documentation in the patient's chart. Prior to the procedure being performed, a "time out" was performed by the physician that confirmed the correct patient, procedure and site.  After informed consent was obtained, the patient's left arm was examined and the Nexplanon rod was noted to be easily palpable. The area was cleaned with alcohol then local anesthesia was infiltrated with 3 ml of 1% lidocaine with epinephrine. The area was prepped with betadine. Using sterile technique, an 11 blade was used to make an incision, and the Nexplanon device was brought to the incision site. The capsule was scrapped off with the scalpel, the Nexplanon grasped with hemostats, and easily removed; the removal site was hemostatic. The Nexplanon was inspected and noted to be intact.  A steri-strip and a pressure dressing was applied.  The patient tolerated the procedure well.  Cornelia Copa MD Attending Center for [facility])

## 2023-01-16 NOTE — Progress Notes (Unsigned)
CC: Pt wants to discuss removal of Nexplanon as Husband had Vasectomy  Pt stating that she is not having any Symptoms but is wanting to get checked and discuss why the recurrent

## 2023-01-18 LAB — CERVICOVAGINAL ANCILLARY ONLY
Bacterial Vaginitis (gardnerella): POSITIVE — AB
Candida Glabrata: NEGATIVE
Candida Vaginitis: NEGATIVE
Comment: NEGATIVE
Comment: NEGATIVE
Comment: NEGATIVE

## 2023-01-20 ENCOUNTER — Encounter: Payer: Self-pay | Admitting: Obstetrics and Gynecology

## 2023-01-20 MED ORDER — METRONIDAZOLE 500 MG PO TABS: 500.0000 mg | ORAL_TABLET | Freq: Two times a day (BID) | ORAL | 0 refills | Status: DC

## 2023-01-20 NOTE — Addendum Note (Signed)
Addended by: [place] Bing on: 01/20/2023 03:26 PM   Modules accepted: Orders

## 2023-02-17 ENCOUNTER — Encounter: Payer: 59 | Admitting: Nurse Practitioner

## 2023-03-02 ENCOUNTER — Encounter (INDEPENDENT_AMBULATORY_CARE_PROVIDER_SITE_OTHER): Payer: Self-pay

## 2023-04-10 ENCOUNTER — Encounter: Payer: Self-pay | Admitting: Nurse Practitioner

## 2023-04-10 ENCOUNTER — Ambulatory Visit (INDEPENDENT_AMBULATORY_CARE_PROVIDER_SITE_OTHER): Payer: Medicaid Other | Admitting: Nurse Practitioner

## 2023-04-10 VITALS — BP 128/84 | HR 75 | Temp 99.2°F | Ht 65.05 in | Wt 214.8 lb

## 2023-04-10 DIAGNOSIS — E781 Pure hyperglyceridemia: Secondary | ICD-10-CM

## 2023-04-10 DIAGNOSIS — Z0000 Encounter for general adult medical examination without abnormal findings: Secondary | ICD-10-CM

## 2023-04-10 DIAGNOSIS — Z1159 Encounter for screening for other viral diseases: Secondary | ICD-10-CM | POA: Diagnosis not present

## 2023-04-10 DIAGNOSIS — R7303 Prediabetes: Secondary | ICD-10-CM

## 2023-04-10 DIAGNOSIS — E669 Obesity, unspecified: Secondary | ICD-10-CM | POA: Diagnosis not present

## 2023-04-10 DIAGNOSIS — F419 Anxiety disorder, unspecified: Secondary | ICD-10-CM

## 2023-04-10 HISTORY — DX: Anxiety disorder, unspecified: F41.9

## 2023-04-10 LAB — CBC
HCT: 42.7 % (ref 36.0–46.0)
Hemoglobin: 14.1 g/dL (ref 12.0–15.0)
MCHC: 33.1 g/dL (ref 30.0–36.0)
MCV: 93.1 fl (ref 78.0–100.0)
Platelets: 351 10*3/uL (ref 150.0–400.0)
RBC: 4.59 Mil/uL (ref 3.87–5.11)
RDW: 13 % (ref 11.5–15.5)
WBC: 9.1 10*3/uL (ref 4.0–10.5)

## 2023-04-10 LAB — COMPREHENSIVE METABOLIC PANEL
ALT: 31 U/L (ref 0–35)
AST: 20 U/L (ref 0–37)
Albumin: 4.4 g/dL (ref 3.5–5.2)
Alkaline Phosphatase: 64 U/L (ref 39–117)
BUN: 9 mg/dL (ref 6–23)
CO2: 25 mEq/L (ref 19–32)
Calcium: 9.3 mg/dL (ref 8.4–10.5)
Chloride: 103 mEq/L (ref 96–112)
Creatinine, Ser: 0.7 mg/dL (ref 0.40–1.20)
GFR: 117.59 mL/min (ref >60.00)
Glucose, Bld: 81 mg/dL (ref 70–99)
Potassium: 3.9 mEq/L (ref 3.5–5.1)
Sodium: 138 mEq/L (ref 135–145)
Total Bilirubin: 0.9 mg/dL (ref 0.2–1.2)
Total Protein: 6.8 g/dL (ref 6.0–8.3)

## 2023-04-10 LAB — LIPID PANEL
Cholesterol: 150 mg/dL (ref 0–200)
HDL: 42 mg/dL (ref >39.00)
LDL Cholesterol: 92 mg/dL (ref 0–99)
NonHDL: 108.44
Total CHOL/HDL Ratio: 4
Triglycerides: 81 mg/dL (ref 0.0–149.0)
VLDL: 16.2 mg/dL (ref 0.0–40.0)

## 2023-04-10 LAB — TSH: TSH: 1.03 u[IU]/mL (ref 0.35–5.50)

## 2023-04-10 LAB — HEMOGLOBIN A1C: Hgb A1c MFr Bld: 4.9 % (ref 4.6–6.5)

## 2023-04-10 MED ORDER — HYDROXYZINE HCL 10 MG PO TABS
10.0000 mg | ORAL_TABLET | Freq: Two times a day (BID) | ORAL | 0 refills | Status: AC | PRN
Start: 2023-04-10 — End: ?

## 2023-04-10 NOTE — Assessment & Plan Note (Signed)
History of the same pending lipid panel

## 2023-04-10 NOTE — Assessment & Plan Note (Signed)
Pending A1c

## 2023-04-10 NOTE — Assessment & Plan Note (Signed)
Discussed age-appropriate immunizations and screening exams.  Did review patient's personal, surgical, social, family histories.  Patient is up-to-date on all age-appropriate vaccinations she would like.  Patient refused flu vaccine.  Patient is too young for CRC screening or breast cancer screening.  Patient is due for her cervical cancer screening is followed by GYN.  Encourage patient to make an appointment.  Patient was given information at discharge about preventative healthcare maintenance with anticipatory guidance.

## 2023-04-10 NOTE — Progress Notes (Signed)
Established Patient Office Visit  Subjective   Patient ID: Colleen Nicholson, female    DOB: [birth year]  Age: 27 y.o. MRN: [id number]  Chief Complaint  Patient presents with   Annual Exam    HPI  for complete physical and follow up of chronic conditions.  Immunizations: -Tetanus: Completed in 2017 -Influenza: refused -Shingles: Too young -Pneumonia: Too young -HPV: utd -Covid: refused   Diet: Fair diet. States that she is doing approx 2 meals a day. States that she will snack throughout the day and does not have breakfast. Water has cut out the majority of sodas Exercise: No regular exercise.   Eye exam: prn  Dental exam: needs updating    Colonoscopy: Too young, currently average risk Lung Cancer Screening: N/A  Pap smear: 04/2020. Dr [place] Bing.  Patient is due for follow-up with GYN  Sleep: states that she is going to bed aroun 11-1am and getting up at 530-6. Does not feel rested. Does not snore     Obesity: interested in weight loss.  Patient states that one of her friends was on phentermine.  Did discuss with her that with phentermine it could cause her anxiety and difficulty sleeping to be worse.  Therapy: wants to talk to a therapist. States that she does not feel depressed. States that she does feel on edge most times. States that her brother was placd on anxiety medication     04/10/2023   10:35 AM 12/28/2022    8:46 AM 12/14/2022   10:15 AM  PHQ9 SCORE ONLY  PHQ-9 Total Score 8 3 4        04/10/2023   10:35 AM 12/28/2022    8:46 AM 12/14/2022   10:15 AM 02/15/2022   11:50 AM  GAD 7 : Generalized Anxiety Score  Nervous, Anxious, on Edge 1 1 1 2   Control/stop worrying 1 0 1 3  Worry too much - different things 1 1 1 3   Trouble relaxing 1 1 0 3  Restless 0 0 0 0  Easily annoyed or irritable 1 1 1 2   Afraid - awful might happen 1 0 0 2  Total GAD 7 Score 6 4 4 15   Anxiety Difficulty Somewhat difficult Not difficult at all Somewhat difficult Somewhat  difficult          Review of Systems  Constitutional:  Negative for chills and fever.  Respiratory:  Negative for shortness of breath.   Cardiovascular:  Negative for chest pain and leg swelling.  Gastrointestinal:  Negative for abdominal pain, blood in stool, constipation, diarrhea, nausea and vomiting.       BM daily   Genitourinary:  Negative for dysuria and hematuria.  Neurological:  Positive for headaches (intermittent). Negative for tingling.  Psychiatric/Behavioral:  Negative for hallucinations and suicidal ideas.       Objective:     BP 128/84   Pulse 75   Temp 99.2 F (37.3 C) (Temporal)   Ht 5' 5.05" (1.652 m)   Wt 214 lb 12.8 oz (97.4 kg)   SpO2 96%   BMI 35.69 kg/m  BP Readings from Last 3 Encounters:  04/10/23 128/84  01/16/23 (!) 129/91  12/28/22 116/76   Wt Readings from Last 3 Encounters:  04/10/23 214 lb 12.8 oz (97.4 kg)  01/16/23 218 lb 6.4 oz (99.1 kg)  12/28/22 221 lb 6 oz (100.4 kg)   SpO2 Readings from Last 3 Encounters:  04/10/23 96%  12/28/22 99%  12/14/22 98%  Physical Exam Vitals and nursing note reviewed.  Constitutional:      Appearance: Normal appearance.  HENT:     Right Ear: Tympanic membrane, ear canal and external ear normal.     Left Ear: Tympanic membrane, ear canal and external ear normal.     Mouth/Throat:     Mouth: Mucous membranes are moist.     Pharynx: Oropharynx is clear.  Eyes:     Extraocular Movements: Extraocular movements intact.     Pupils: Pupils are equal, round, and reactive to light.  Cardiovascular:     Rate and Rhythm: Normal rate and regular rhythm.     Pulses: Normal pulses.     Heart sounds: Normal heart sounds.  Pulmonary:     Effort: Pulmonary effort is normal.     Breath sounds: Normal breath sounds.  Abdominal:     General: Bowel sounds are normal. There is no distension.     Palpations: There is no mass.     Tenderness: There is no abdominal tenderness.     Hernia: No hernia is  present.  Musculoskeletal:     Right lower leg: No edema.     Left lower leg: No edema.  Lymphadenopathy:     Cervical: No cervical adenopathy.  Skin:    General: Skin is warm.  Neurological:     General: No focal deficit present.     Mental Status: She is alert.     Deep Tendon Reflexes:     Reflex Scores:      Bicep reflexes are 2+ on the right side and 2+ on the left side.      Patellar reflexes are 2+ on the right side and 2+ on the left side.    Comments: Bilateral upper and lower extremity strength 5/5  Psychiatric:        Mood and Affect: Mood normal.        Behavior: Behavior normal.        Thought Content: Thought content normal.        Judgment: Judgment normal.      No results found for any visits on 04/10/23.    The ASCVD Risk score (Arnett DK, et al., 2019) failed to calculate for the following reasons:   The 2019 ASCVD risk score is only valid for ages 16 to 36    Assessment & Plan:   Problem List Items Addressed This Visit       Other   Obesity (BMI 30-39.9)    Pending TSH, lipid panel, A1c.  Patient is medically managed weight loss.  Do not think she be a great candidate for phentermine but a possible candidate for the GLP-1 or GLP-1/GIP 1 pending labs.      Relevant Orders   Hemoglobin A1c   TSH   Prediabetes    Pending A1c.      Relevant Orders   Hemoglobin A1c   ?Hypertriglyceridemia    History of the same pending lipid panel      Relevant Orders   Lipid panel   Preventative health care    Discussed age-appropriate immunizations and screening exams.  Did review patient's personal, surgical, social, family histories.  Patient is up-to-date on all age-appropriate vaccinations she would like.  Patient refused flu vaccine.  Patient is too young for CRC screening or breast cancer screening.  Patient is due for her cervical cancer screening is followed by GYN.  Encourage patient to make an appointment.  Patient was given information at discharge  about preventative healthcare maintenance with anticipatory guidance.      Relevant Orders   CBC   Comprehensive metabolic panel   TSH   Anxiety    Ambulatory referral to psychology for therapy.  Patient can do hydroxyzine 10 mg twice daily as needed, sedation precautions reviewed.      Relevant Medications   hydrOXYzine (ATARAX) 10 MG tablet   Other Relevant Orders   Ambulatory referral to Psychology   Other Visit Diagnoses     Encounter for hepatitis C screening test for low risk patient    -  Primary   Relevant Orders   Hepatitis C Antibody       Return in about 1 year (around 04/09/2024) for CPE and Labs.    Audria Nine, NP

## 2023-04-10 NOTE — Assessment & Plan Note (Signed)
Ambulatory referral to psychology for therapy.  Patient can do hydroxyzine 10 mg twice daily as needed, sedation precautions reviewed.

## 2023-04-10 NOTE — Patient Instructions (Signed)
Nice to see you today Reach out to the office and let me know how the medication does. It can make you sleepy Follow up with me in 1 year, sooner if you need me

## 2023-04-10 NOTE — Assessment & Plan Note (Signed)
Pending TSH, lipid panel, A1c.  Patient is medically managed weight loss.  Do not think she be a great candidate for phentermine but a possible candidate for the GLP-1 or GLP-1/GIP 1 pending labs.

## 2023-04-11 LAB — HEPATITIS C ANTIBODY: Hepatitis C Ab: NONREACTIVE

## 2023-04-25 ENCOUNTER — Encounter: Payer: Self-pay | Admitting: Nurse Practitioner

## 2023-04-25 ENCOUNTER — Ambulatory Visit: Payer: Medicaid Other | Admitting: Nurse Practitioner

## 2023-04-25 VITALS — BP 118/78 | HR 87 | Temp 98.2°F | Ht 65.05 in | Wt 212.6 lb

## 2023-04-25 DIAGNOSIS — Z6835 Body mass index (BMI) 35.0-35.9, adult: Secondary | ICD-10-CM | POA: Diagnosis not present

## 2023-04-25 DIAGNOSIS — R519 Headache, unspecified: Secondary | ICD-10-CM | POA: Diagnosis not present

## 2023-04-25 DIAGNOSIS — E669 Obesity, unspecified: Secondary | ICD-10-CM

## 2023-04-25 MED ORDER — PHENTERMINE HCL 15 MG PO CAPS
15.0000 mg | ORAL_CAPSULE | ORAL | 0 refills | Status: DC
Start: 2023-04-25 — End: 2023-05-30

## 2023-04-25 NOTE — Patient Instructions (Signed)
Nice to see you today I have sent in the medication as discussed  Follow up with me in 1 month, sooner If you need me

## 2023-04-25 NOTE — Progress Notes (Signed)
Acute Office Visit  Subjective:     Patient ID: Colleen Nicholson, female    DOB: May 21, 1996, 27 y.o.   MRN: [id number]  Chief Complaint  Patient presents with   Headache    Pt complains of constant migraine a week ago that lasted 5 days. Pt states OTC medication (aleve, ibuprofen).    Medication Management    Pt would like to discuss hydroxyzine      Patient is in today for Multiple concerns with a history of prediabetes, hld, anxiety and vaping   Headache: states that it startd over a week ago. States that it lasted 4-5 days. States that it was the frontal area dn maxillary sinus. States that she tried ibuporfen and aleve that did not hlep. States that she tried to rest and get out of the light.states that she tired a cold rag. No relief . States that she did not have light or sound   Anxiety: states that she has not tried it yet but does not know when to try  Obesity: states that she had some crackersyesterday then a meal at 930 that night. States that she is drinking water through out the day. States no sturctured exercise.    Review of Systems  Constitutional:  Negative for chills and fever.  Eyes:  Negative for blurred vision, double vision and photophobia.  Respiratory:  Negative for shortness of breath.   Cardiovascular:  Negative for chest pain.  Neurological:  Positive for headaches. Negative for dizziness.        Objective:    BP 118/78   Pulse 87   Temp 98.2 F (36.8 C) (Oral)   Ht 5' 5.05" (1.652 m)   Wt 212 lb 9.6 oz (96.4 kg)   SpO2 97%   BMI 35.32 kg/m  BP Readings from Last 3 Encounters:  04/25/23 118/78  04/10/23 128/84  01/16/23 (!) 129/91   Wt Readings from Last 3 Encounters:  04/25/23 212 lb 9.6 oz (96.4 kg)  04/10/23 214 lb 12.8 oz (97.4 kg)  01/16/23 218 lb 6.4 oz (99.1 kg)      Physical Exam Vitals and nursing note reviewed.  Constitutional:      Appearance: Normal appearance.  HENT:     Mouth/Throat:     Mouth: Mucous membranes  are moist.     Pharynx: Oropharynx is clear.  Eyes:     Extraocular Movements: Extraocular movements intact.     Pupils: Pupils are equal, round, and reactive to light.  Cardiovascular:     Rate and Rhythm: Normal rate and regular rhythm.     Heart sounds: Normal heart sounds.  Pulmonary:     Effort: Pulmonary effort is normal.     Breath sounds: Normal breath sounds.  Neurological:     General: No focal deficit present.     Mental Status: She is alert.     Motor: Motor function is intact.     Gait: Gait is intact.     Deep Tendon Reflexes:     Reflex Scores:      Bicep reflexes are 2+ on the right side and 2+ on the left side.      Patellar reflexes are 2+ on the right side and 2+ on the left side.    Comments: Bilateral upper and lower extremity strength 5/5     No results found for any visits on 04/25/23.      Assessment & Plan:   Problem List Items Addressed This Visit  Other   Obesity (BMI 30-39.9) - Primary    Working on lifestyle applications have lost approximately 6 pounds.  Will do phentermine 15 mg every morning for 30 days and then she will need to follow-up.      Relevant Medications   phentermine 15 MG capsule   Sinus headache    Sounds to be sinus headache that has since resolved.  Neurological exam benign today       Meds ordered this encounter  Medications   phentermine 15 MG capsule    Sig: Take 1 capsule (15 mg total) by mouth every morning.    Dispense:  30 capsule    Refill:  0    Order Specific Question:   Supervising Provider    Answer:   Roxy Manns A [[id number]]    Return in about 4 weeks (around 05/23/2023) for weight/medicatoin recheck .  Audria Nine, NP

## 2023-04-25 NOTE — Assessment & Plan Note (Signed)
Working on lifestyle applications have lost approximately 6 pounds.  Will do phentermine 15 mg every morning for 30 days and then she will need to follow-up.

## 2023-04-25 NOTE — Assessment & Plan Note (Signed)
Sounds to be sinus headache that has since resolved.  Neurological exam benign today

## 2023-05-30 ENCOUNTER — Ambulatory Visit: Payer: Medicaid Other | Admitting: Nurse Practitioner

## 2023-05-30 VITALS — BP 110/82 | HR 80 | Temp 98.1°F | Ht 65.05 in | Wt 201.8 lb

## 2023-05-30 DIAGNOSIS — E669 Obesity, unspecified: Secondary | ICD-10-CM | POA: Diagnosis not present

## 2023-05-30 MED ORDER — PHENTERMINE HCL 37.5 MG PO CAPS: 37.5000 mg | ORAL_CAPSULE | ORAL | 0 refills | Status: DC

## 2023-05-30 NOTE — Progress Notes (Signed)
   Established Patient Office Visit  Subjective   Patient ID: ADREONNA HINCK, female    DOB: [birth year]  Age: 27 y.o. MRN: [id number]  Chief Complaint  Patient presents with   Medication Management    Pt complains of doing well on phentermine. Last dose was Sunday.    Medication Refill    Phentermine       Obesity: patient was seen by me on 04/25/2023 and was started on phentermine for weight oss she has lost 11 pounds. States that she is feeling good on the medication. States that in the beginning she did feel like she had energy and decresed appetite. States that she feels like the medicaoitn is not as effective She is trying to get 2 meals a day and will snack.  No structured exercise     Review of Systems  Constitutional:  Negative for chills and fever.  Respiratory:  Negative for shortness of breath.   Cardiovascular:  Negative for chest pain and palpitations.  Gastrointestinal:  Negative for abdominal pain, constipation, diarrhea, nausea and vomiting.       Bm daily   Neurological:  Negative for headaches.  Psychiatric/Behavioral:  Negative for hallucinations and suicidal ideas.       Objective:     BP 110/82   Pulse 80   Temp 98.1 F (36.7 C) (Oral)   Ht 5' 5.05" (1.652 m)   Wt 201 lb 12.8 oz (91.5 kg)   SpO2 97%   BMI 33.53 kg/m  BP Readings from Last 3 Encounters:  05/30/23 110/82  04/25/23 118/78  04/10/23 128/84   Wt Readings from Last 3 Encounters:  05/30/23 201 lb 12.8 oz (91.5 kg)  04/25/23 212 lb 9.6 oz (96.4 kg)  04/10/23 214 lb 12.8 oz (97.4 kg)   SpO2 Readings from Last 3 Encounters:  05/30/23 97%  04/25/23 97%  04/10/23 96%      Physical Exam Vitals and nursing note reviewed.  Constitutional:      Appearance: Normal appearance.  Cardiovascular:     Rate and Rhythm: Normal rate and regular rhythm.     Heart sounds: Normal heart sounds.  Pulmonary:     Effort: Pulmonary effort is normal.     Breath sounds: Normal breath sounds.   Abdominal:     General: Bowel sounds are normal.  Neurological:     Mental Status: She is alert.      No results found for any visits on 05/30/23.    The ASCVD Risk score (Arnett DK, et al., 2019) failed to calculate for the following reasons:   The 2019 ASCVD risk score is only valid for ages 55 to 77    Assessment & Plan:   Problem List Items Addressed This Visit       Other   Obesity (BMI 30-39.9) - Primary    Patient is doing well on phentermine 15 mg daily.  Has lost approximately 1 pounds.  States towards the end she felt that the medication was coming less effective we will titrate up to 37.5 mg of phentermine.  Follow-up 1 month did have a conversation about switching her to a GLP-1 receptor agonist      Relevant Medications   phentermine 37.5 MG capsule    Return in about 4 weeks (around 06/27/2023) for weight/medicatoin recheck .    Audria Nine, NP

## 2023-05-30 NOTE — Patient Instructions (Signed)
Nice to see you today I have sent the medication into the pharmacy  Follow up with me in 1 month, sooner if you need me

## 2023-05-30 NOTE — Assessment & Plan Note (Signed)
Patient is doing well on phentermine 15 mg daily.  Has lost approximately 1 pounds.  States towards the end Colleen Nicholson felt that the medication was coming less effective we will titrate up to 37.5 mg of phentermine.  Follow-up 1 month did have a conversation about switching her to a GLP-1 receptor agonist

## 2023-06-20 ENCOUNTER — Ambulatory Visit: Payer: Medicaid Other | Admitting: Nurse Practitioner

## 2023-06-28 ENCOUNTER — Ambulatory Visit: Payer: Medicaid Other | Admitting: Nurse Practitioner

## 2023-06-28 ENCOUNTER — Encounter: Payer: Self-pay | Admitting: Nurse Practitioner

## 2023-06-28 VITALS — BP 118/84 | HR 96 | Temp 98.1°F | Ht 65.05 in | Wt 194.4 lb

## 2023-06-28 DIAGNOSIS — R7303 Prediabetes: Secondary | ICD-10-CM

## 2023-06-28 DIAGNOSIS — E785 Hyperlipidemia, unspecified: Secondary | ICD-10-CM | POA: Diagnosis not present

## 2023-06-28 DIAGNOSIS — E669 Obesity, unspecified: Secondary | ICD-10-CM | POA: Diagnosis not present

## 2023-06-28 MED ORDER — PHENTERMINE HCL 37.5 MG PO CAPS
37.5000 mg | ORAL_CAPSULE | ORAL | 0 refills | Status: AC
Start: 2023-06-28 — End: ?

## 2023-06-28 MED ORDER — WEGOVY 0.25 MG/0.5ML ~~LOC~~ SOAJ
0.2500 mg | SUBCUTANEOUS | 0 refills | Status: AC
Start: 2023-06-28 — End: ?

## 2023-06-28 NOTE — Progress Notes (Signed)
Established Patient Office Visit  Subjective   Patient ID: Colleen Nicholson, female    DOB: 1996/03/31  Age: 27 y.o. MRN: [id number]  Chief Complaint  Patient presents with   Follow-up    Weight and medication recheck. Pt states that she is doing well on phentermine. She also states need for refill, has one pill left.     HPI  Obesity: Patient was seen by me on 04/25/2023 and patient was started on phentermine 15 mg daily.  She followed up in 05/30/2023 and was moved up to phentermine 37.5 mg.  Patient is here for follow-up today. At last office visit.  She was getting 2 meals a day and a snack.  No structured exercise at that juncture at last office that she lost 11 pounds total  Patient states she still not having any shortness exercise.  States she has still cut back on sodas no more than 1 day.  Patient states she did well over the holidays for Thanksgiving to have 1 plate where she would traditionally have several plates throughout the day at other gatherings.   Review of Systems  Constitutional:  Negative for chills and fever.  Respiratory:  Negative for shortness of breath.   Cardiovascular:  Negative for chest pain.  Neurological:  Positive for dizziness. Negative for headaches.      Objective:     BP 118/84   Pulse 96   Temp 98.1 F (36.7 C) (Oral)   Ht 5' 5.05" (1.652 m)   Wt 194 lb 6.4 oz (88.2 kg)   SpO2 96%   BMI 32.30 kg/m  BP Readings from Last 3 Encounters:  06/28/23 118/84  05/30/23 110/82  04/25/23 118/78   Wt Readings from Last 3 Encounters:  06/28/23 194 lb 6.4 oz (88.2 kg)  05/30/23 201 lb 12.8 oz (91.5 kg)  04/25/23 212 lb 9.6 oz (96.4 kg)   SpO2 Readings from Last 3 Encounters:  06/28/23 96%  05/30/23 97%  04/25/23 97%      Physical Exam Vitals and nursing note reviewed.  Constitutional:      Appearance: Normal appearance.  Cardiovascular:     Rate and Rhythm: Normal rate and regular rhythm.     Heart sounds: Normal heart sounds.   Pulmonary:     Effort: Pulmonary effort is normal.     Breath sounds: Normal breath sounds.  Abdominal:     General: Bowel sounds are normal.  Neurological:     Mental Status: She is alert.      No results found for any visits on 06/28/23.    The ASCVD Risk score (Arnett DK, et al., 2019) failed to calculate for the following reasons:   The 2019 ASCVD risk score is only valid for ages 6 to 55    Assessment & Plan:   Problem List Items Addressed This Visit       Other   Obesity (BMI 30-39.9) - Primary    Will continue phentermine 37.5 mg daily for 30 more days.  They will transition to [facility] patient denies any family history of medullary thyroid cancer.  We did discuss that this medication is not safe for she plans to become pregnant.  Did review common side effects of medication.  Patient will not start Wegovy until she completes the 30 days of phentermine send today.  She will need to follow-up with me after being on [facility] for 3 months      Relevant Medications   phentermine 37.5 MG capsule  Semaglutide-Weight Management (WEGOVY) 0.25 MG/0.5ML SOAJ   Other Relevant Orders   Referral to Nutrition and Diabetes Services   Prediabetes   Relevant Medications   Semaglutide-Weight Management (WEGOVY) 0.25 MG/0.5ML SOAJ   Other Relevant Orders   Referral to Nutrition and Diabetes Services   Hyperlipidemia   Relevant Medications   Semaglutide-Weight Management (WEGOVY) 0.25 MG/0.5ML SOAJ   Other Relevant Orders   Referral to Nutrition and Diabetes Services    Return in about 4 months (around 10/27/2023) for weight/medication recheck .    Audria Nine, NP

## 2023-06-28 NOTE — Assessment & Plan Note (Signed)
Will continue phentermine 37.5 mg daily for 30 more days.  They will transition to [facility] patient denies any family history of medullary thyroid cancer.  We did discuss that this medication is not safe for she plans to become pregnant.  Did review common side effects of medication.  Patient will not start Wegovy until she completes the 30 days of phentermine send today.  She will need to follow-up with me after being on [facility] for 3 months

## 2023-06-28 NOTE — Patient Instructions (Signed)
Nice to see you today Take this last month of the phentermine Once you have completed that make a Nurse visit for injection training on wegovy Follow up with me in 4 months, sooner if you need me

## 2023-07-05 ENCOUNTER — Other Ambulatory Visit (HOSPITAL_COMMUNITY): Payer: Self-pay

## 2023-07-05 ENCOUNTER — Telehealth: Payer: Self-pay

## 2023-07-05 NOTE — Telephone Encounter (Signed)
Pharmacy Patient Advocate Encounter   Received notification from CoverMyMeds that prior authorization for Wegovy 0.25MG /0.5ML auto-injectors is required/requested.   Insurance verification completed.   The patient is insured through [facility] .   Per test claim: PA required; PA submitted to above mentioned insurance via CoverMyMeds Key/confirmation #/EOC [facility] Status is pending

## 2023-07-05 NOTE — Telephone Encounter (Signed)
Pharmacy Patient Advocate Encounter  Received notification from [facility] that Prior Authorization for Wegovy 0.25MG /0.5ML auto-injectors  has been APPROVED from 07/05/23 to 01/01/24. Ran test claim, Copay is $4.00. This test claim was processed through [facility]- copay amounts may vary at other pharmacies due to pharmacy/plan contracts, or as the patient moves through the different stages of their insurance plan.  I spoke to the patient's pharmacy, [facility], and they said they are unable to get [facility] in stock. [facility] said the company they get their meds through has the wegovy on backorder so the prescription would have to be transferred to a pharmacy that has it. I spoke to our pharmacy and we have this in stock if she would like for one of our [facility] pharmacies to fill the [facility].    PA #/Case ID/Reference #: [id number]

## 2023-07-05 NOTE — Telephone Encounter (Signed)
Left voicemail for patient to call the office back.   

## 2023-07-07 NOTE — Telephone Encounter (Signed)
Contacted pt. Stated that she is not actively taking WeGovy right now and prefers to hold off on any transfer of pharmacies. Will give office a call if [facility] is still on backorder/changes her mind.

## 2023-07-07 NOTE — Telephone Encounter (Signed)
Patient is returning a call from the office she would like a call back regarding this

## 2023-07-07 NOTE — Telephone Encounter (Signed)
Left voicemail for patient to call the office back.   

## 2023-08-01 ENCOUNTER — Ambulatory Visit: Payer: Medicaid Other

## 2023-08-01 NOTE — Telephone Encounter (Signed)
 Pt was scheduled for NV to learn  how to admin Wegovy  injection. Pt came to appt but did not know what appt was for. I advised and pt said that she has not picked up Wegovy  yet. Pt still taking phentermine . Pt will ck with pharmacy about picking up Wegovy  and pt will cb to schedule NV when finishes phentermine  and able to pick  up Wegovy . Sending FYI to CHRISTELLA Crandall NP.

## 2023-08-01 NOTE — Telephone Encounter (Signed)
Noted. Agree with plan of care.

## 2023-10-30 ENCOUNTER — Ambulatory Visit: Payer: Medicaid Other | Admitting: Nurse Practitioner

## 2023-10-30 VITALS — BP 100/68 | HR 68 | Temp 98.0°F | Ht 65.05 in | Wt 179.0 lb

## 2023-10-30 DIAGNOSIS — E669 Obesity, unspecified: Secondary | ICD-10-CM

## 2023-10-30 NOTE — Assessment & Plan Note (Signed)
 Patient did not pick up medication from the pharmacy yet.  Encourage patient to stop by pharmacy and get it filled to make a nurse visit to first injection with staff.  Start Wegovy 0.25 mg once weekly with anticipated to titrate up each month.  Patient will continue working on healthy lifestyle modifications inclusive of diet and exercise.  Patient will follow-up with me in 3 months sooner if she needs any

## 2023-10-30 NOTE — Progress Notes (Signed)
   Established Patient Office Visit  Subjective   Patient ID: Colleen Nicholson, female    DOB: 19-Aug-1995  Age: 28 y.o. MRN: [id number]  Chief Complaint  Patient presents with   Follow-up    Pt finished phetermine but has not started [facility]. Pt requests further assistance on injections.     HPI  Obeisty: patient was seen by me and started on phentermine and then was going to be transitioned over to [employer]. Starting weigh twas 212 and she got down to  194 on our last office visit. States that she did not start the wegovy. States that over christmas she started the pills and the stopped for a while. Then she got sick and she started back.  She has not been doing exercise  States that she is not eating a lot. She is getting fuller faster. She is eating 3 meals a day and then other days she will 1-2 meals a day.  She is drinking water exclusively    Review of Systems  Constitutional:  Negative for chills and fever.  Respiratory:  Negative for shortness of breath.   Cardiovascular:  Negative for chest pain.  Gastrointestinal:  Negative for abdominal pain, constipation, diarrhea, nausea and vomiting.  Neurological:  Negative for headaches.      Objective:     BP 100/68   Pulse 68   Temp 98 F (36.7 C) (Oral)   Ht 5' 5.05" (1.652 m)   Wt 179 lb (81.2 kg)   SpO2 98%   BMI 29.74 kg/m  BP Readings from Last 3 Encounters:  10/30/23 100/68  06/28/23 118/84  05/30/23 110/82   Wt Readings from Last 3 Encounters:  10/30/23 179 lb (81.2 kg)  06/28/23 194 lb 6.4 oz (88.2 kg)  05/30/23 201 lb 12.8 oz (91.5 kg)   SpO2 Readings from Last 3 Encounters:  10/30/23 98%  06/28/23 96%  05/30/23 97%      Physical Exam Vitals and nursing note reviewed.  Constitutional:      Appearance: Normal appearance.  Cardiovascular:     Rate and Rhythm: Normal rate and regular rhythm.     Heart sounds: Normal heart sounds.  Pulmonary:     Effort: Pulmonary effort is normal.     Breath  sounds: Normal breath sounds.  Abdominal:     General: Bowel sounds are normal.  Neurological:     Mental Status: She is alert.      No results found for any visits on 10/30/23.    The ASCVD Risk score (Arnett DK, et al., 2019) failed to calculate for the following reasons:   The 2019 ASCVD risk score is only valid for ages 91 to 48    Assessment & Plan:   Problem List Items Addressed This Visit       Other   Obesity (BMI 30-39.9) - Primary   Patient did not pick up medication from the pharmacy yet.  Encourage patient to stop by pharmacy and get it filled to make a nurse visit to first injection with staff.  Start Wegovy 0.25 mg once weekly with anticipated to titrate up each month.  Patient will continue working on healthy lifestyle modifications inclusive of diet and exercise.  Patient will follow-up with me in 3 months sooner if she needs any       Return in about 3 months (around 01/29/2024) for weight/wegovy recheck .    Audria Nine, NP

## 2023-10-30 NOTE — Patient Instructions (Signed)
 Good to see you today Call or stop by the pharmacy and they should be able to fill the medication for you Follow up with me in 3 months Make a nurse visit in 1-2 weeks and bring the medication with you to that visit

## 2023-11-14 ENCOUNTER — Ambulatory Visit (INDEPENDENT_AMBULATORY_CARE_PROVIDER_SITE_OTHER)

## 2023-11-14 DIAGNOSIS — E669 Obesity, unspecified: Secondary | ICD-10-CM | POA: Diagnosis not present

## 2023-11-14 NOTE — Progress Notes (Signed)
 Patient in office today for teaching on Wegovy  . Patient has brought medication with them to appointment. Printed off Instructions from [employer]. Patient reviewed instruction video on manufactures website Reviewed all instructions with patient and had repeat back to me. Instructed patient on proper cleaning of injection site and any signs of infection. Patient was able to administer medication with very little directions. Patient felt comfortable continuing in home setting. Reviewed with patient proper disposal of pen after use. Will call the office if any questions.

## 2024-01-29 ENCOUNTER — Ambulatory Visit: Admitting: Nurse Practitioner

## 2024-01-29 NOTE — Progress Notes (Deleted)
   Established Patient Office Visit  Subjective   Patient ID: Colleen Nicholson, female    DOB: 09-10-1995  Age: 28 y.o. MRN: [id number]  No chief complaint on file.   HPI  Obesity: Patient was last seen by me on 10/30/2023.  At that office visit patient had not picked up Wegovy .  The anticipation was to start Wegovy  0.25 mg once weekly with the titration of monthly.  Does not like patient started the medication. {History (Optional):23778}  ROS    Objective:     There were no vitals taken for this visit. {Vitals History (Optional):23777}  Physical Exam   No results found for any visits on 01/29/24.  {Labs (Optional):23779}  The ASCVD Risk score (Arnett DK, et al., 2019) failed to calculate for the following reasons:   The 2019 ASCVD risk score is only valid for ages 107 to 102    Assessment & Plan:   Problem List Items Addressed This Visit   None   No follow-ups on file.    Adina Crandall, NP

## 2024-04-11 ENCOUNTER — Encounter: Payer: Self-pay | Admitting: Nurse Practitioner

## 2024-04-11 ENCOUNTER — Ambulatory Visit: Payer: Medicaid Other | Admitting: Nurse Practitioner

## 2024-04-11 VITALS — BP 120/78 | HR 65 | Temp 97.7°F | Ht 64.5 in | Wt 165.8 lb

## 2024-04-11 DIAGNOSIS — R7303 Prediabetes: Secondary | ICD-10-CM | POA: Diagnosis not present

## 2024-04-11 DIAGNOSIS — Z87891 Personal history of nicotine dependence: Secondary | ICD-10-CM | POA: Diagnosis not present

## 2024-04-11 DIAGNOSIS — E785 Hyperlipidemia, unspecified: Secondary | ICD-10-CM | POA: Diagnosis not present

## 2024-04-11 DIAGNOSIS — E663 Overweight: Secondary | ICD-10-CM

## 2024-04-11 DIAGNOSIS — Z0000 Encounter for general adult medical examination without abnormal findings: Secondary | ICD-10-CM | POA: Diagnosis not present

## 2024-04-11 LAB — URINALYSIS, MICROSCOPIC ONLY: RBC / HPF: NONE SEEN (ref 0–?)

## 2024-04-11 LAB — COMPREHENSIVE METABOLIC PANEL WITH GFR
ALT: 33 U/L (ref 0–35)
AST: 20 U/L (ref 0–37)
Albumin: 4.3 g/dL (ref 3.5–5.2)
Alkaline Phosphatase: 73 U/L (ref 39–117)
BUN: 8 mg/dL (ref 6–23)
CO2: 28 meq/L (ref 19–32)
Calcium: 9.2 mg/dL (ref 8.4–10.5)
Chloride: 104 meq/L (ref 96–112)
Creatinine, Ser: 0.65 mg/dL (ref 0.40–1.20)
GFR: 118.87 mL/min (ref >60.00)
Glucose, Bld: 76 mg/dL (ref 70–99)
Potassium: 4 meq/L (ref 3.5–5.1)
Sodium: 140 meq/L (ref 135–145)
Total Bilirubin: 0.7 mg/dL (ref 0.2–1.2)
Total Protein: 6.8 g/dL (ref 6.0–8.3)

## 2024-04-11 LAB — CBC WITH DIFFERENTIAL/PLATELET
Basophils Absolute: 0.1 K/uL (ref 0.0–0.1)
Basophils Relative: 0.6 % (ref 0.0–3.0)
Eosinophils Absolute: 0.2 K/uL (ref 0.0–0.7)
Eosinophils Relative: 1.9 % (ref 0.0–5.0)
HCT: 42.8 % (ref 36.0–46.0)
Hemoglobin: 14.3 g/dL (ref 12.0–15.0)
Lymphocytes Relative: 29.2 % (ref 12.0–46.0)
Lymphs Abs: 2.5 K/uL (ref 0.7–4.0)
MCHC: 33.5 g/dL (ref 30.0–36.0)
MCV: 93 fl (ref 78.0–100.0)
Monocytes Absolute: 0.4 K/uL (ref 0.1–1.0)
Monocytes Relative: 5 % (ref 3.0–12.0)
Neutro Abs: 5.3 K/uL (ref 1.4–7.7)
Neutrophils Relative %: 63.3 % (ref 43.0–77.0)
Platelets: 403 K/uL — ABNORMAL HIGH (ref 150.0–400.0)
RBC: 4.6 Mil/uL (ref 3.87–5.11)
RDW: 13.2 % (ref 11.5–15.5)
WBC: 8.4 K/uL (ref 4.0–10.5)

## 2024-04-11 LAB — LIPID PANEL
Cholesterol: 146 mg/dL (ref 0–200)
HDL: 39.8 mg/dL (ref >39.00)
LDL Cholesterol: 85 mg/dL (ref 0–99)
NonHDL: 106.06
Total CHOL/HDL Ratio: 4
Triglycerides: 104 mg/dL (ref 0.0–149.0)
VLDL: 20.8 mg/dL (ref 0.0–40.0)

## 2024-04-11 LAB — TSH: TSH: 0.76 u[IU]/mL (ref 0.35–5.50)

## 2024-04-11 LAB — HEMOGLOBIN A1C: Hgb A1c MFr Bld: 5 % (ref 4.6–6.5)

## 2024-04-11 NOTE — Assessment & Plan Note (Signed)
 History of the same.  Patient has lost weight and working on lifestyle modifications pending lipid panel today

## 2024-04-11 NOTE — Assessment & Plan Note (Signed)
 Discussed age-appropriate immunizations and screenings.  Reviewed patient's personal, surgical, social, family histories.  Patient is up-to-date with all age-appropriate vaccinations would like.  Declined flu vaccine today.  Patient is too young for CRC screening.  Too young for breast cancer screening for osteoporosis screening.  Patient is overdue for Pap smear patient was given information to call her GYN and set up.  Patient was given information at discharge about preventative healthcare maintenance with anticipatory guidance.

## 2024-04-11 NOTE — Progress Notes (Signed)
 Established Patient Office Visit  Subjective   Patient ID: Colleen Nicholson, female    DOB: [birth year]  Age: 28 y.o. MRN: [id number]  Chief Complaint  Patient presents with   Annual Exam    HPI  for complete physical and follow up of chronic conditions.  Immunizations: -Tetanus: Completed in 2017 -Influenza: declined -Shingles: Too young -Pneumonia: Too young -HPV: Up-to-date  Diet: Fair diet. She is eating 1-2 meals a day and she will snack sometimes. She does drink water and sometimes she will do a soda Exercise: No regular exercise.  Eye exam: PRN Dental exam: Needs updating     Colonoscopy: Too young, currently average risk Lung Cancer Screening: N/A  Pap smear: Due, 05/11/2020, done by Bebe Furry.  Mammogram: Too young, currently average risk  DEXA: Too young  Sleep: going to bed around 11-2a and will get up 530-630 during the week. Does not feel rested.      Review of Systems  Constitutional:  Negative for chills and fever.  Respiratory:  Negative for shortness of breath.   Cardiovascular:  Negative for chest pain and leg swelling.  Gastrointestinal:  Negative for abdominal pain, blood in stool, constipation, diarrhea, nausea and vomiting.       Bm daily   Genitourinary:  Negative for dysuria and hematuria.  Neurological:  Negative for tingling and headaches.  Psychiatric/Behavioral:  Negative for hallucinations and suicidal ideas.       Objective:     BP 120/78   Pulse 65   Temp 97.7 F (36.5 C) (Oral)   Ht 5' 4.5 (1.638 m)   Wt 165 lb 12.8 oz (75.2 kg)   LMP 04/07/2024 (Exact Date)   SpO2 99%   BMI 28.02 kg/m  BP Readings from Last 3 Encounters:  04/11/24 120/78  10/30/23 100/68  06/28/23 118/84   Wt Readings from Last 3 Encounters:  04/11/24 165 lb 12.8 oz (75.2 kg)  10/30/23 179 lb (81.2 kg)  06/28/23 194 lb 6.4 oz (88.2 kg)   SpO2 Readings from Last 3 Encounters:  04/11/24 99%  10/30/23 98%  06/28/23 96%      Physical  Exam Vitals and nursing note reviewed.  Constitutional:      Appearance: Normal appearance.  HENT:     Right Ear: Tympanic membrane, ear canal and external ear normal.     Left Ear: Tympanic membrane, ear canal and external ear normal.     Mouth/Throat:     Mouth: Mucous membranes are moist.     Pharynx: Oropharynx is clear.  Eyes:     Extraocular Movements: Extraocular movements intact.     Pupils: Pupils are equal, round, and reactive to light.  Cardiovascular:     Rate and Rhythm: Normal rate and regular rhythm.     Pulses: Normal pulses.     Heart sounds: Normal heart sounds.  Pulmonary:     Effort: Pulmonary effort is normal.     Breath sounds: Normal breath sounds.  Abdominal:     General: Bowel sounds are normal. There is no distension.     Palpations: There is no mass.     Tenderness: There is no abdominal tenderness.     Hernia: No hernia is present.  Musculoskeletal:     Right lower leg: No edema.     Left lower leg: No edema.  Lymphadenopathy:     Cervical: No cervical adenopathy.  Skin:    General: Skin is warm.  Neurological:     General:  No focal deficit present.     Mental Status: She is alert.     Deep Tendon Reflexes:     Reflex Scores:      Bicep reflexes are 2+ on the right side and 2+ on the left side.      Patellar reflexes are 2+ on the right side and 2+ on the left side.    Comments: Bilateral upper and lower extremity strength 5/5  Psychiatric:        Mood and Affect: Mood normal.        Behavior: Behavior normal.        Thought Content: Thought content normal.        Judgment: Judgment normal.      No results found for any visits on 04/11/24.    The ASCVD Risk score (Arnett DK, et al., 2019) failed to calculate for the following reasons:   The 2019 ASCVD risk score is only valid for ages 9 to 17    Assessment & Plan:   Problem List Items Addressed This Visit       Other   Prediabetes   History of the same patient is doing  healthy lifestyles and has lost approximately 30 pounds.  Pending A1c      Relevant Orders   Hemoglobin A1c   Hyperlipidemia   History of the same.  Patient has lost weight and working on lifestyle modifications pending lipid panel today      Relevant Orders   Lipid panel   Preventative health care - Primary   Discussed age-appropriate immunizations and screenings.  Reviewed patient's personal, surgical, social, family histories.  Patient is up-to-date with all age-appropriate vaccinations would like.  Declined flu vaccine today.  Patient is too young for CRC screening.  Too young for breast cancer screening for osteoporosis screening.  Patient is overdue for Pap smear patient was given information to call her GYN and set up.  Patient was given information at discharge about preventative healthcare maintenance with anticipatory guidance.      Relevant Orders   CBC with Differential/Platelet   Comprehensive metabolic panel with GFR   TSH   Former smoker   History of the same pending urine microscopy to rule out microscopic hematuria.      Relevant Orders   Urine Microscopic   Other Visit Diagnoses       Overweight (BMI 25.0-29.9)       Relevant Orders   TSH       Return in about 1 year (around 04/11/2025) for CPE and Labs.    Adina Crandall, NP

## 2024-04-11 NOTE — Patient Instructions (Addendum)
 Nice to see you today I will be in touch with the labs once I have them  Follow up with me in 1 year, sooner if you need me  Call and scheduled your Pap smear at:  Address: [address] [place], [place] [address] Phone: [phone]

## 2024-04-11 NOTE — Assessment & Plan Note (Signed)
 History of the same pending urine microscopy to rule out microscopic hematuria.

## 2024-04-11 NOTE — Assessment & Plan Note (Signed)
 History of the same patient is doing healthy lifestyles and has lost approximately 30 pounds.  Pending A1c

## 2024-04-16 ENCOUNTER — Ambulatory Visit: Payer: Self-pay | Admitting: Nurse Practitioner

## 2025-04-11 ENCOUNTER — Encounter: Admitting: Nurse Practitioner
